# Patient Record
Sex: Female | Born: 1975 | Race: White | Hispanic: No | Marital: Married | Smoking: Current every day smoker
Health system: Southern US, Community
[De-identification: ages and names within clinical notes are randomized; demographics above are authoritative.]

## PROBLEM LIST (undated history)

## (undated) DIAGNOSIS — J449 Chronic obstructive pulmonary disease, unspecified: Secondary | ICD-10-CM

## (undated) DIAGNOSIS — M549 Dorsalgia, unspecified: Secondary | ICD-10-CM

## (undated) HISTORY — PX: TUBAL LIGATION: SHX77

---

## 2009-01-02 ENCOUNTER — Emergency Department (HOSPITAL_BASED_OUTPATIENT_CLINIC_OR_DEPARTMENT_OTHER): Admission: EM | Admit: 2009-01-02 | Discharge: 2009-01-02 | Payer: Self-pay | Admitting: Emergency Medicine

## 2009-02-08 ENCOUNTER — Emergency Department (HOSPITAL_COMMUNITY): Admission: EM | Admit: 2009-02-08 | Discharge: 2009-02-09 | Payer: Self-pay | Admitting: Emergency Medicine

## 2009-03-24 ENCOUNTER — Emergency Department (HOSPITAL_BASED_OUTPATIENT_CLINIC_OR_DEPARTMENT_OTHER): Admission: EM | Admit: 2009-03-24 | Discharge: 2009-03-24 | Payer: Self-pay | Admitting: Emergency Medicine

## 2009-09-19 ENCOUNTER — Emergency Department (HOSPITAL_COMMUNITY): Admission: EM | Admit: 2009-09-19 | Discharge: 2009-09-19 | Payer: Self-pay | Admitting: Emergency Medicine

## 2009-10-11 ENCOUNTER — Emergency Department (HOSPITAL_COMMUNITY): Admission: EM | Admit: 2009-10-11 | Discharge: 2009-10-11 | Payer: Self-pay | Admitting: Emergency Medicine

## 2009-10-25 ENCOUNTER — Ambulatory Visit: Payer: Self-pay | Admitting: Radiology

## 2009-10-25 ENCOUNTER — Emergency Department (HOSPITAL_BASED_OUTPATIENT_CLINIC_OR_DEPARTMENT_OTHER): Admission: EM | Admit: 2009-10-25 | Discharge: 2009-10-25 | Payer: Self-pay | Admitting: Emergency Medicine

## 2010-11-15 LAB — DIFFERENTIAL
Eosinophils Relative: 1 % (ref 0–5)
Lymphocytes Relative: 22 % (ref 12–46)
Lymphs Abs: 1 10*3/uL (ref 0.7–4.0)
Neutrophils Relative %: 69 % (ref 43–77)

## 2010-11-15 LAB — BASIC METABOLIC PANEL
CO2: 24 mEq/L (ref 19–32)
Calcium: 9.6 mg/dL (ref 8.4–10.5)
Chloride: 104 mEq/L (ref 96–112)
Creatinine, Ser: 0.76 mg/dL (ref 0.4–1.2)
GFR calc non Af Amer: >60 mL/min (ref >60)
Sodium: 138 mEq/L (ref 135–145)

## 2010-11-15 LAB — CBC
MCHC: 32.9 g/dL (ref 30.0–36.0)
MCV: 81.7 fL (ref 78.0–100.0)
Platelets: 285 10*3/uL (ref 150–400)

## 2010-12-06 LAB — URINALYSIS, ROUTINE W REFLEX MICROSCOPIC
Bilirubin Urine: NEGATIVE
Ketones, ur: NEGATIVE mg/dL
Nitrite: NEGATIVE

## 2010-12-06 LAB — URINE MICROSCOPIC-ADD ON

## 2010-12-06 LAB — URINE CULTURE: Colony Count: >=100000

## 2010-12-06 LAB — WET PREP, GENITAL: Yeast Wet Prep HPF POC: NONE SEEN

## 2010-12-07 LAB — COMPREHENSIVE METABOLIC PANEL
ALT: 15 U/L (ref 0–35)
Alkaline Phosphatase: 54 U/L (ref 39–117)
Creatinine, Ser: 0.63 mg/dL (ref 0.4–1.2)
GFR calc non Af Amer: >60 mL/min (ref >60)
Potassium: 3.3 mEq/L — ABNORMAL LOW (ref 3.5–5.1)
Sodium: 139 mEq/L (ref 135–145)
Total Bilirubin: 0.5 mg/dL (ref 0.3–1.2)

## 2010-12-07 LAB — URINALYSIS, ROUTINE W REFLEX MICROSCOPIC
Hgb urine dipstick: NEGATIVE
Nitrite: NEGATIVE
Specific Gravity, Urine: 1.01 (ref 1.005–1.030)

## 2010-12-07 LAB — GC/CHLAMYDIA PROBE AMP, GENITAL
Chlamydia, DNA Probe: NEGATIVE
GC Probe Amp, Genital: NEGATIVE

## 2010-12-07 LAB — DIFFERENTIAL
Basophils Relative: 0 % (ref 0–1)
Lymphocytes Relative: 18 % (ref 12–46)
Lymphs Abs: 1.2 10*3/uL (ref 0.7–4.0)
Monocytes Relative: 6 % (ref 3–12)

## 2010-12-07 LAB — WET PREP, GENITAL

## 2010-12-07 LAB — LIPASE, BLOOD: Lipase: 21 U/L (ref 11–59)

## 2010-12-07 LAB — RPR: RPR Ser Ql: NONREACTIVE

## 2010-12-07 LAB — CBC
Platelets: 222 10*3/uL (ref 150–400)
RDW: 15.1 % (ref 11.5–15.5)

## 2013-06-06 ENCOUNTER — Encounter (HOSPITAL_BASED_OUTPATIENT_CLINIC_OR_DEPARTMENT_OTHER): Payer: Self-pay | Admitting: Emergency Medicine

## 2013-06-06 ENCOUNTER — Emergency Department (HOSPITAL_BASED_OUTPATIENT_CLINIC_OR_DEPARTMENT_OTHER)
Admission: EM | Admit: 2013-06-06 | Discharge: 2013-06-06 | Disposition: A | Discharge status: Home / Self Care | Payer: No Typology Code available for payment source | Attending: Emergency Medicine | Admitting: Emergency Medicine

## 2013-06-06 DIAGNOSIS — Z79899 Other long term (current) drug therapy: Secondary | ICD-10-CM | POA: Insufficient documentation

## 2013-06-06 DIAGNOSIS — F17200 Nicotine dependence, unspecified, uncomplicated: Secondary | ICD-10-CM | POA: Insufficient documentation

## 2013-06-06 DIAGNOSIS — M545 Low back pain, unspecified: Secondary | ICD-10-CM | POA: Insufficient documentation

## 2013-06-06 DIAGNOSIS — M549 Dorsalgia, unspecified: Secondary | ICD-10-CM

## 2013-06-06 HISTORY — DX: Dorsalgia, unspecified: M54.9

## 2013-06-06 MED ORDER — HYDROCODONE-ACETAMINOPHEN 5-325 MG PO TABS: 2.0000 | ORAL_TABLET | ORAL | Status: DC | PRN

## 2013-06-06 MED ORDER — HYDROMORPHONE HCL PF 1 MG/ML IJ SOLN
1.0000 mg | Freq: Once | INTRAMUSCULAR | Status: AC
  Administered 2013-06-06: 1 mg via INTRAMUSCULAR
  Filled 2013-06-06: qty 1

## 2013-06-06 MED ORDER — ONDANSETRON HCL 4 MG/2ML IJ SOLN
4.0000 mg | Freq: Once | INTRAMUSCULAR | Status: AC
  Administered 2013-06-06: 4 mg via INTRAMUSCULAR
  Filled 2013-06-06: qty 2

## 2013-06-06 MED ORDER — METHYLPREDNISOLONE SODIUM SUCC 125 MG IJ SOLR
125.0000 mg | Freq: Once | INTRAMUSCULAR | Status: AC
  Administered 2013-06-06: 125 mg via INTRAMUSCULAR
  Filled 2013-06-06: qty 2

## 2013-06-06 MED ORDER — METHOCARBAMOL 500 MG PO TABS: 500.0000 mg | ORAL_TABLET | Freq: Two times a day (BID) | ORAL | Status: DC

## 2013-06-06 NOTE — ED Notes (Signed)
Pt c/o "pop" to lower back while picking up box of toilet paper at work-denies as WC injury

## 2013-06-06 NOTE — ED Provider Notes (Signed)
CSN: [id number]     Arrival date & time 06/06/13  1113 History   First MD Initiated Contact with Patient 06/06/13 1212     Chief Complaint  Patient presents with  . Back Pain   (Consider location/radiation/quality/duration/timing/severity/associated sxs/prior Treatment) Patient is a 37 y.o. female presenting with back pain. The history is provided by the patient. No language interpreter was used.  Back Pain Location:  Lumbar spine Quality:  Aching Radiates to:  Does not radiate Pain severity:  Moderate Pain is:  Worse during the day Onset quality:  Sudden Timing:  Constant Progression:  Worsening Chronicity:  New Context: lifting heavy objects   Relieved by:  Nothing Worsened by:  Movement Ineffective treatments:  None tried Pt reports severe pain with lifting a box  Past Medical History  Diagnosis Date  . Back pain    Past Surgical History  Procedure Laterality Date  . Cesarean section     No family history on file. History  Substance Use Topics  . Smoking status: Current Every Day Smoker  . Smokeless tobacco: Not on file  . Alcohol Use: No   OB History   Grav Para Term Preterm Abortions TAB SAB Ect Mult Living                 Review of Systems  Musculoskeletal: Positive for back pain.  All other systems reviewed and are negative.    Allergies  Sulfa antibiotics  Home Medications   Current Outpatient Rx  Name  Route  Sig  Dispense  Refill  . HYDROcodone-acetaminophen (NORCO/VICODIN) 5-325 MG per tablet   Oral   Take 2 tablets by mouth every 4 (four) hours as needed for pain.   20 tablet   0   . methocarbamol (ROBAXIN) 500 MG tablet   Oral   Take 1 tablet (500 mg total) by mouth 2 (two) times daily.   20 tablet   0    BP 110/66  Pulse 88  Temp(Src) 97.9 F (36.6 C) (Oral)  Resp 16  Ht 5\' 6"  (1.676 m)  Wt 150 lb (68.04 kg)  BMI 24.22 kg/m2  SpO2 100%  LMP 05/23/2013 Physical Exam  Nursing note and vitals reviewed. Constitutional: She  appears well-developed and well-nourished.  HENT:  Head: Normocephalic.  Right Ear: External ear normal.  Left Ear: External ear normal.  Nose: Nose normal.  Mouth/Throat: Oropharynx is clear and moist.  Eyes: Conjunctivae are normal. Pupils are equal, round, and reactive to light.  Neck: Normal range of motion. Neck supple.  Cardiovascular: Normal rate and normal heart sounds.   Pulmonary/Chest: Effort normal and breath sounds normal.  Abdominal: Soft. Bowel sounds are normal.  Musculoskeletal: Normal range of motion.  Neurological: She is alert.  Skin: Skin is warm.    ED Course  Procedures (including critical care time) Labs Review Labs Reviewed - No data to display Imaging Review No results found.  MDM   1. Back pain    Solumedrol and dilaudid  IM.  Rx for hydrocodone and robaxin   Elson Areas, PA-C 06/06/13 1357

## 2013-06-06 NOTE — ED Provider Notes (Signed)
Medical screening examination/treatment/procedure(s) were performed by non-physician practitioner and as supervising physician I was immediately available for consultation/collaboration.  Pariss Hommes, MD 06/06/13 1444 

## 2013-09-09 ENCOUNTER — Encounter (HOSPITAL_BASED_OUTPATIENT_CLINIC_OR_DEPARTMENT_OTHER): Payer: Self-pay | Admitting: Emergency Medicine

## 2013-09-09 ENCOUNTER — Emergency Department (HOSPITAL_BASED_OUTPATIENT_CLINIC_OR_DEPARTMENT_OTHER)
Admission: EM | Admit: 2013-09-09 | Discharge: 2013-09-09 | Disposition: A | Discharge status: Home / Self Care | Payer: No Typology Code available for payment source | Attending: Emergency Medicine | Admitting: Emergency Medicine

## 2013-09-09 DIAGNOSIS — X500XXA Overexertion from strenuous movement or load, initial encounter: Secondary | ICD-10-CM | POA: Insufficient documentation

## 2013-09-09 DIAGNOSIS — G8929 Other chronic pain: Secondary | ICD-10-CM | POA: Insufficient documentation

## 2013-09-09 DIAGNOSIS — F17200 Nicotine dependence, unspecified, uncomplicated: Secondary | ICD-10-CM | POA: Insufficient documentation

## 2013-09-09 DIAGNOSIS — Y9289 Other specified places as the place of occurrence of the external cause: Secondary | ICD-10-CM | POA: Insufficient documentation

## 2013-09-09 DIAGNOSIS — S39012A Strain of muscle, fascia and tendon of lower back, initial encounter: Secondary | ICD-10-CM

## 2013-09-09 DIAGNOSIS — R110 Nausea: Secondary | ICD-10-CM | POA: Insufficient documentation

## 2013-09-09 DIAGNOSIS — S335XXA Sprain of ligaments of lumbar spine, initial encounter: Secondary | ICD-10-CM | POA: Insufficient documentation

## 2013-09-09 DIAGNOSIS — Y9389 Activity, other specified: Secondary | ICD-10-CM | POA: Insufficient documentation

## 2013-09-09 MED ORDER — CYCLOBENZAPRINE HCL 10 MG PO TABS: 10.0000 mg | ORAL_TABLET | Freq: Two times a day (BID) | ORAL | Status: DC | PRN

## 2013-09-09 MED ORDER — IBUPROFEN 600 MG PO TABS: 600.0000 mg | ORAL_TABLET | Freq: Four times a day (QID) | ORAL | Status: DC | PRN

## 2013-09-09 MED ORDER — HYDROCODONE-ACETAMINOPHEN 5-325 MG PO TABS: 2.0000 | ORAL_TABLET | ORAL | Status: DC | PRN

## 2013-09-09 NOTE — ED Notes (Signed)
Pt reports she lifted her foot in the shower last night and heard a pop. C/o back pain going down both legs

## 2013-09-09 NOTE — ED Provider Notes (Signed)
CSN: [id number]     Arrival date & time 09/09/13  1825 History  This chart was scribed for non-physician practitioner Kerrie BuffaloHope Tadeusz Stahl, NP working with Hurman HornJohn M Bednar, MD by Dorothey Basemania Sutton, ED Scribe. This patient was seen in room MH06/MH06 and the patient's care was started at 8:14 PM.    Chief Complaint  Patient presents with  . Back Pain   The history is provided by the patient. No language interpreter was used.   HPI Comments: Monica Arnold is a 38 y.o. 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 She states that the pain radiates down the bilateral legs. She reports some associated intermittent nausea secondary to pain. Patient reports that her current symptoms feel similar to her past chronic pain, but was exacerbated after the incident. She reports taking ibuprofen and Robaxin at home with mild, temporary relief. She denies bowel or bladder incontinence, fever, chills, emesis, abdominal pain. Patient has no other pertinent medical history.   Past Medical History  Diagnosis Date  . Back pain    Past Surgical History  Procedure Laterality Date  . Cesarean section     No family history on file. History  Substance Use Topics  . Smoking status: Current Every Day Smoker  . Smokeless tobacco: Not on file  . Alcohol Use: No   OB History   Grav Para Term Preterm Abortions TAB SAB Ect Mult Living                 Review of Systems  Constitutional: Negative for fever and chills.  Gastrointestinal: Positive for nausea. Negative for vomiting and abdominal pain.  Musculoskeletal: Positive for back pain.    Allergies  Sulfa antibiotics  Home Medications   Current Outpatient Rx  Name  Route  Sig  Dispense  Refill  . HYDROcodone-acetaminophen (NORCO/VICODIN) 5-325 MG per tablet   Oral    Take 2 tablets by mouth every 4 (four) hours as needed for pain.   20 tablet   0   . methocarbamol (ROBAXIN) 500 MG tablet   Oral   Take 1 tablet (500 mg total) by mouth 2 (two) times daily.   20 tablet   0    Triage Vitals: BP 115/57  Pulse 91  Temp(Src) 98.2 F (36.8 C) (Oral)  Resp 18  Ht 5\' 6"  (1.676 m)  Wt 150 lb (68.04 kg)  BMI 24.22 kg/m2  SpO2 100%  LMP 08/29/2013  Physical Exam  Nursing note and vitals reviewed. Constitutional: She is oriented to person, place, and time. She appears well-developed and well-nourished. No distress.  HENT:  Head: Normocephalic and atraumatic.  Eyes: Conjunctivae are normal.  Neck: Normal range of motion. Neck supple.  Cardiovascular: Normal rate, regular rhythm, normal heart sounds and intact distal pulses.   Pulses:      Radial pulses are 2+ on the right side, and 2+ on the left side.       Dorsalis pedis pulses are 2+ on the right side, and 2+ on the left side.  Pulmonary/Chest: Effort normal and breath sounds normal. No respiratory distress.  Abdominal: Soft. Bowel sounds are normal. She exhibits no distension. There is no tenderness.  Musculoskeletal: Normal range of motion.  Bilateral straight-leg raise without difficulty. Tenderness to palpation to the bilateral lumbar  paraspinal muscles that radiates into the bilateral buttocks.   Neurological: She is alert and oriented to person, place, and time. She has normal reflexes. She displays normal reflexes.  Reflex Scores:      Brachioradialis reflexes are 2+ on the right side and 2+ on the left side.      Patellar reflexes are 2+ on the right side and 2+ on the left side. Distal sensation intact and equal bilaterally. Good strength of bilateral upper and lower extremities. Normal, steady gait with no foot-drag.  Skin: Skin is warm and dry.  Psychiatric: She has a normal mood and affect. Her behavior is normal.    ED Course  Procedures (including critical care time)  DIAGNOSTIC  STUDIES: Oxygen Saturation is 100% on room air, normal by my interpretation.    COORDINATION OF CARE: 8:21 PM- Discussed that imaging will not be necessary today in the ED. Will discharge patient with anti-inflammatory pain medication, and muscle relaxants. Discussed treatment plan with patient at bedside and patient verbalized agreement.    MDM  38 y.o. female with low back pain. Normal neuro exam. Patient with history of chronic pain. Discussed with the patient and all questioned fully answered. She will return if any problems arise.    Medication List    TAKE these medications       cyclobenzaprine 10 MG tablet  Commonly known as:  FLEXERIL  Take 1 tablet (10 mg total) by mouth 2 (two) times daily as needed for muscle spasms.     ibuprofen 600 MG tablet  Commonly known as:  ADVIL,MOTRIN  Take 1 tablet (600 mg total) by mouth every 6 (six) hours as needed.      ASK your doctor about these medications       HYDROcodone-acetaminophen 5-325 MG per tablet  Commonly known as:  NORCO/VICODIN  Take 2 tablets by mouth every 4 (four) hours as needed for pain.  Ask about: Which instructions should I use?     HYDROcodone-acetaminophen 5-325 MG per tablet  Commonly known as:  NORCO/VICODIN  Take 2 tablets by mouth every 4 (four) hours as needed.  Ask about: Which instructions should I use?     methocarbamol 500 MG tablet  Commonly known as:  ROBAXIN  Take 1 tablet (500 mg total) by mouth 2 (two) times daily.           Janne Napoleon, [place] 09/10/13 [phone]

## 2013-09-09 NOTE — ED Notes (Signed)
Denies difficulty with bowel/bladder.  Pain radiates to left hip and down anterior right thigh.  Denies numbness.

## 2013-09-10 NOTE — ED Provider Notes (Signed)
Medical screening examination/treatment/procedure(s) were performed by non-physician practitioner and as supervising physician I was immediately available for consultation/collaboration.  EKG Interpretation   None        Hurman HornJohn M Arlyce Circle, MD 09/10/13 2119

## 2013-10-23 ENCOUNTER — Encounter: Payer: Self-pay | Admitting: Adult Health

## 2013-11-20 ENCOUNTER — Encounter: Payer: Self-pay | Admitting: Obstetrics & Gynecology

## 2013-11-27 ENCOUNTER — Encounter: Payer: Self-pay | Admitting: Obstetrics & Gynecology

## 2013-11-27 ENCOUNTER — Other Ambulatory Visit (HOSPITAL_COMMUNITY)
Admission: RE | Admit: 2013-11-27 | Discharge: 2013-11-27 | Disposition: A | Discharge status: Home / Self Care | Payer: No Typology Code available for payment source | Source: Ambulatory Visit | Attending: Obstetrics & Gynecology | Admitting: Obstetrics & Gynecology

## 2013-11-27 ENCOUNTER — Ambulatory Visit (INDEPENDENT_AMBULATORY_CARE_PROVIDER_SITE_OTHER): Payer: Self-pay | Admitting: Obstetrics & Gynecology

## 2013-11-27 VITALS — BP 110/80 | Ht 66.0 in | Wt 166.0 lb

## 2013-11-27 DIAGNOSIS — N920 Excessive and frequent menstruation with regular cycle: Secondary | ICD-10-CM

## 2013-11-27 DIAGNOSIS — N946 Dysmenorrhea, unspecified: Secondary | ICD-10-CM

## 2013-11-27 DIAGNOSIS — N898 Other specified noninflammatory disorders of vagina: Secondary | ICD-10-CM

## 2013-11-27 DIAGNOSIS — Z1151 Encounter for screening for human papillomavirus (HPV): Secondary | ICD-10-CM | POA: Insufficient documentation

## 2013-11-27 DIAGNOSIS — Z01419 Encounter for gynecological examination (general) (routine) without abnormal findings: Secondary | ICD-10-CM | POA: Insufficient documentation

## 2013-11-27 DIAGNOSIS — N939 Abnormal uterine and vaginal bleeding, unspecified: Secondary | ICD-10-CM

## 2013-11-27 LAB — POCT HEMOGLOBIN: Hemoglobin: 11 g/dL — AB (ref 12.2–16.2)

## 2013-11-27 MED ORDER — MEGESTROL ACETATE 40 MG PO TABS: ORAL_TABLET | ORAL | Status: DC

## 2013-11-27 NOTE — Addendum Note (Signed)
Addended by: Richardson ChiquitoRAVIS, Aibhlinn Kalmar M on: 11/27/2013 03:11 PM   Modules accepted: Orders

## 2013-11-27 NOTE — Progress Notes (Signed)
Patient ID: Monica Arnold, female   DOB: Dec 18, 1975, 38 y.o.   MRN: [id number] Pt has been having increasingly worsening heavier painful periods for the past 6 -8 months, always a problem though S/p btl No other chronic medical problems Bleeds q 2 weeks for 7-10 days Not sexually active, no history of painful intercourse  Exam Vulva is normal without lesions Vagina is pink moist without discharge Cervix normal in appearance and pap is done Uterus is retroverted NSSC Adnexa is negative with normal sized ovaries   Menometrorrhagia Dysmenorrhea  Megestrol 40 mg daily Follow up in 6 weeks  Past Medical History  Diagnosis Date  . Back pain     Past Surgical History  Procedure Laterality Date  . Cesarean section      OB History   Grav Para Term Preterm Abortions TAB SAB Ect Mult Living                  Allergies  Allergen Reactions  . Sulfa Antibiotics Other (See Comments)    blisters    History   Social History  . Marital Status: Single    Spouse Name: N/A    Number of Children: N/A  . Years of Education: N/A   Social History Main Topics  . Smoking status: Current Every Day Smoker  . Smokeless tobacco: None  . Alcohol Use: No  . Drug Use: No  . Sexual Activity: Not Currently    Birth Control/ Protection: None   Other Topics Concern  . None   Social History Narrative  . None    Family History  Problem Relation Age of Onset  . Heart disease Mother   . Heart disease Father

## 2013-12-25 ENCOUNTER — Encounter: Payer: Self-pay | Admitting: *Deleted

## 2014-01-08 ENCOUNTER — Encounter: Payer: Self-pay | Admitting: Obstetrics & Gynecology

## 2014-01-08 ENCOUNTER — Ambulatory Visit (INDEPENDENT_AMBULATORY_CARE_PROVIDER_SITE_OTHER): Payer: Self-pay | Admitting: Obstetrics & Gynecology

## 2014-01-08 ENCOUNTER — Telehealth: Payer: Self-pay | Admitting: Obstetrics & Gynecology

## 2014-01-08 VITALS — BP 120/80 | Wt 166.0 lb

## 2014-01-08 DIAGNOSIS — N920 Excessive and frequent menstruation with regular cycle: Secondary | ICD-10-CM

## 2014-01-08 LAB — POCT HEMOGLOBIN: HEMOGLOBIN: 12.6 g/dL (ref 12.2–16.2)

## 2014-01-08 MED ORDER — MEGESTROL ACETATE 40 MG PO TABS: ORAL_TABLET | ORAL | Status: DC

## 2014-01-08 NOTE — Addendum Note (Signed)
Addended by: Criss AlvinePULLIAM, Avanish Cerullo G on: 01/08/2014 12:26 PM   Modules accepted: Orders

## 2014-01-08 NOTE — Progress Notes (Signed)
Patient ID: Monica FantasiaDana Arnold, female   DOB: 09/17/1975, 38 y.o.   MRN: [id number] Still bleeding on megestrol Will make plans to proceed with endoablation scheduled

## 2014-01-09 ENCOUNTER — Telehealth: Payer: Self-pay | Admitting: *Deleted

## 2014-01-09 ENCOUNTER — Encounter (HOSPITAL_COMMUNITY): Payer: Self-pay | Admitting: Pharmacy Technician

## 2014-01-09 NOTE — Telephone Encounter (Signed)
Pt had several questions about her surgery date and recovery time. Pt informed usually a weeks recovery time for endometrial ablation. Pt does have a post op appt on 01/23/2014.

## 2014-01-10 NOTE — Patient Instructions (Signed)
Your procedure is scheduled on: 01/16/2014  [facility] at  7:30   AM.  Call this number if you have problems the morning of surgery: [phone]   Remember:   Do not drink or eat food:After Midnight.  :  Take these medicines the morning of surgery with A SIP OF WATER: none   Do not wear jewelry, make-up or nail polish.  Do not wear lotions, powders, or perfumes. You may wear deodorant.  Do not shave 48 hours prior to surgery. Men may shave face and neck.  Do not bring valuables to the hospital.  Contacts, dentures or bridgework may not be worn into surgery.  Leave suitcase in the car. After surgery it may be brought to your room.  For patients admitted to the hospital, checkout time is 11:00 AM the day of discharge.   Patients discharged the day of surgery will not be allowed to drive home.    Special Instructions: Shower using CHG night before surgery and shower the day of surgery use CHG.  Use special wash - you have one bottle of CHG for all showers.  You should use approximately 1/2 of the bottle for each shower.   Please read over the following fact sheets that you were given: Pain Booklet, MRSA Information, Surgical Site Infection Prevention and Care and Recovery After Surgery  Laparoscopic Cholecystectomy, Care After Refer to this sheet in the next few weeks. These instructions provide you with information on caring for yourself after your procedure. Your health care provider may also give you more specific instructions. Your treatment has been planned according to current medical practices, but problems sometimes occur. Call your health care provider if you have any problems or questions after your procedure. WHAT TO EXPECT AFTER THE PROCEDURE After your procedure, it is typical to have the following:  Pain at your incision sites. You will be given pain medicines to control the pain.  Mild nausea or vomiting. This should improve after the first 24 hours.  Bloating and  possibly shoulder pain from the gas used during the procedure. This will improve after the first 24 hours. HOME CARE INSTRUCTIONS   Change bandages (dressings) as directed by your health care provider.  Keep the wound dry and clean. You may wash the wound gently with soap and water. Gently blot or dab the area dry.  Do not take baths or use swimming pools or hot tubs for 2 weeks or until your health care provider approves.  Only take over-the-counter or prescription medicines as directed by your health care provider.  Continue your normal diet as directed by your health care provider.  Do not lift anything heavier than 10 pounds (4.5 kg) until your health care provider approves.  Do not play contact sports for 1 week or until your health care provider approves. SEEK MEDICAL CARE IF:   You have redness, swelling, or increasing pain in the wound.  You notice yellowish-white fluid (pus) coming from the wound.  You have drainage from the wound that lasts longer than 1 day.  You notice a bad smell coming from the wound or dressing.  Your surgical cuts (incisions) break open. SEEK IMMEDIATE MEDICAL CARE IF:   You develop a rash.  You have difficulty breathing.  You have chest pain.  You have a fever.  You have increasing pain in the shoulders (shoulder strap areas).  You have dizzy episodes or faint while standing.  You have severe abdominal pain.  You feel  sick to your stomach (nauseous) or throw up (vomit) and this lasts for more than 1 day. Document Released: 08/16/2005 Document Revised: 06/06/2013 Document Reviewed: 03/28/2013 [facility] Patient Information 2014 [place]. General Anesthesia, Adult, Care After Refer to this sheet in the next few weeks. These instructions provide you with information on caring for yourself after your procedure. Your health care provider may also give you more specific instructions. Your treatment has been planned according to current  medical practices, but problems sometimes occur. Call your health care provider if you have any problems or questions after your procedure. WHAT TO EXPECT AFTER THE PROCEDURE After the procedure, it is typical to experience:  Sleepiness.  Nausea and vomiting. HOME CARE INSTRUCTIONS  For the first 24 hours after general anesthesia:  Have a responsible person with you.  Do not drive a car. If you are alone, do not take public transportation.  Do not drink alcohol.  Do not take medicine that has not been prescribed by your health care provider.  Do not sign important papers or make important decisions.  You may resume a normal diet and activities as directed by your health care provider.  Change bandages (dressings) as directed.  If you have questions or problems that seem related to general anesthesia, call the hospital and ask for the anesthetist or anesthesiologist on call. SEEK MEDICAL CARE IF:  You have nausea and vomiting that continue the day after anesthesia.  You develop a rash. SEEK IMMEDIATE MEDICAL CARE IF:   You have difficulty breathing.  You have chest pain.  You have any allergic problems. Document Released: 11/22/2000 Document Revised: 04/18/2013 Document Reviewed: 03/01/2013 [facility] Patient Information 2014 [place], [place].

## 2014-01-11 ENCOUNTER — Encounter (HOSPITAL_COMMUNITY)
Admission: RE | Admit: 2014-01-11 | Discharge: 2014-01-11 | Disposition: A | Discharge status: Home / Self Care | Payer: No Typology Code available for payment source | Source: Ambulatory Visit | Attending: Obstetrics & Gynecology | Admitting: Obstetrics & Gynecology

## 2014-01-11 ENCOUNTER — Encounter (HOSPITAL_COMMUNITY): Payer: Self-pay

## 2014-01-11 DIAGNOSIS — Z01812 Encounter for preprocedural laboratory examination: Secondary | ICD-10-CM | POA: Insufficient documentation

## 2014-01-11 LAB — COMPREHENSIVE METABOLIC PANEL
ALBUMIN: 3.8 g/dL (ref 3.5–5.2)
ALT: 12 U/L (ref 0–35)
AST: 14 U/L (ref 0–37)
Alkaline Phosphatase: 61 U/L (ref 39–117)
BILIRUBIN TOTAL: 0.5 mg/dL (ref 0.3–1.2)
BUN: 13 mg/dL (ref 6–23)
CHLORIDE: 103 meq/L (ref 96–112)
CO2: 23 mEq/L (ref 19–32)
CREATININE: 0.66 mg/dL (ref 0.50–1.10)
Calcium: 9.4 mg/dL (ref 8.4–10.5)
GFR calc Af Amer: >90 mL/min (ref >90)
GFR calc non Af Amer: >90 mL/min (ref >90)
Glucose, Bld: 108 mg/dL — ABNORMAL HIGH (ref 70–99)
POTASSIUM: 4.1 meq/L (ref 3.7–5.3)
Sodium: 140 mEq/L (ref 137–147)
TOTAL PROTEIN: 7.3 g/dL (ref 6.0–8.3)

## 2014-01-11 LAB — URINALYSIS, ROUTINE W REFLEX MICROSCOPIC
Bilirubin Urine: NEGATIVE
GLUCOSE, UA: NEGATIVE mg/dL
Ketones, ur: NEGATIVE mg/dL
Leukocytes, UA: NEGATIVE
NITRITE: NEGATIVE
PH: 6 (ref 5.0–8.0)
Protein, ur: NEGATIVE mg/dL
Specific Gravity, Urine: 1.01 (ref 1.005–1.030)
Urobilinogen, UA: 0.2 mg/dL (ref 0.0–1.0)

## 2014-01-11 LAB — CBC
HEMATOCRIT: 36.5 % (ref 36.0–46.0)
Hemoglobin: 12.1 g/dL (ref 12.0–15.0)
MCH: 24.7 pg — ABNORMAL LOW (ref 26.0–34.0)
MCHC: 33.2 g/dL (ref 30.0–36.0)
MCV: 74.5 fL — AB (ref 78.0–100.0)
PLATELETS: 263 10*3/uL (ref 150–400)
RBC: 4.9 MIL/uL (ref 3.87–5.11)
RDW: 15.3 % (ref 11.5–15.5)
WBC: 5.5 10*3/uL (ref 4.0–10.5)

## 2014-01-11 LAB — HCG, QUANTITATIVE, PREGNANCY: hCG, Beta Chain, Quant, S: <1 m[IU]/mL (ref <5)

## 2014-01-11 LAB — URINE MICROSCOPIC-ADD ON

## 2014-01-16 ENCOUNTER — Encounter (HOSPITAL_COMMUNITY): Payer: Self-pay

## 2014-01-16 ENCOUNTER — Encounter (HOSPITAL_COMMUNITY)
Admission: RE | Disposition: A | Discharge status: Home / Self Care | Payer: Self-pay | Source: Ambulatory Visit | Attending: Obstetrics & Gynecology

## 2014-01-16 ENCOUNTER — Encounter (HOSPITAL_COMMUNITY): Payer: No Typology Code available for payment source | Admitting: Anesthesiology

## 2014-01-16 ENCOUNTER — Ambulatory Visit (HOSPITAL_COMMUNITY)
Admission: RE | Admit: 2014-01-16 | Discharge: 2014-01-16 | Disposition: A | Discharge status: Home / Self Care | Payer: No Typology Code available for payment source | Source: Ambulatory Visit | Attending: Obstetrics & Gynecology | Admitting: Obstetrics & Gynecology

## 2014-01-16 ENCOUNTER — Ambulatory Visit (HOSPITAL_COMMUNITY): Payer: No Typology Code available for payment source | Admitting: Anesthesiology

## 2014-01-16 DIAGNOSIS — F17200 Nicotine dependence, unspecified, uncomplicated: Secondary | ICD-10-CM | POA: Insufficient documentation

## 2014-01-16 DIAGNOSIS — N920 Excessive and frequent menstruation with regular cycle: Secondary | ICD-10-CM | POA: Insufficient documentation

## 2014-01-16 DIAGNOSIS — N840 Polyp of corpus uteri: Secondary | ICD-10-CM

## 2014-01-16 DIAGNOSIS — Z9889 Other specified postprocedural states: Secondary | ICD-10-CM

## 2014-01-16 DIAGNOSIS — N946 Dysmenorrhea, unspecified: Secondary | ICD-10-CM | POA: Insufficient documentation

## 2014-01-16 HISTORY — PX: DILITATION & CURRETTAGE/HYSTROSCOPY WITH THERMACHOICE ABLATION: SHX5569

## 2014-01-16 SURGERY — DILATATION & CURETTAGE/HYSTEROSCOPY WITH THERMACHOICE ABLATION
Anesthesia: General | Site: Vagina

## 2014-01-16 MED ORDER — KETOROLAC TROMETHAMINE 30 MG/ML IJ SOLN
INTRAMUSCULAR | Status: AC
  Filled 2014-01-16: qty 1

## 2014-01-16 MED ORDER — MIDAZOLAM HCL 2 MG/2ML IJ SOLN
INTRAMUSCULAR | Status: AC
  Filled 2014-01-16: qty 2

## 2014-01-16 MED ORDER — KETOROLAC TROMETHAMINE 10 MG PO TABS: 10.0000 mg | ORAL_TABLET | Freq: Three times a day (TID) | ORAL | Status: DC | PRN

## 2014-01-16 MED ORDER — LIDOCAINE HCL (PF) 1 % IJ SOLN
INTRAMUSCULAR | Status: AC
  Filled 2014-01-16: qty 5

## 2014-01-16 MED ORDER — LACTATED RINGERS IV SOLN
INTRAVENOUS | Status: DC
  Administered 2014-01-16: 08:00:00 via INTRAVENOUS

## 2014-01-16 MED ORDER — FENTANYL CITRATE 0.05 MG/ML IJ SOLN
25.0000 ug | INTRAMUSCULAR | Status: AC
  Administered 2014-01-16 (×2): 25 ug via INTRAVENOUS

## 2014-01-16 MED ORDER — ONDANSETRON HCL 4 MG/2ML IJ SOLN
INTRAMUSCULAR | Status: AC
  Filled 2014-01-16: qty 2

## 2014-01-16 MED ORDER — FENTANYL CITRATE 0.05 MG/ML IJ SOLN
INTRAMUSCULAR | Status: AC
  Filled 2014-01-16: qty 2

## 2014-01-16 MED ORDER — KETOROLAC TROMETHAMINE 30 MG/ML IJ SOLN
30.0000 mg | Freq: Once | INTRAMUSCULAR | Status: AC
  Administered 2014-01-16: 30 mg via INTRAVENOUS

## 2014-01-16 MED ORDER — GLYCOPYRROLATE 0.2 MG/ML IJ SOLN
0.2000 mg | Freq: Once | INTRAMUSCULAR | Status: AC
  Administered 2014-01-16: 0.2 mg via INTRAVENOUS

## 2014-01-16 MED ORDER — LIDOCAINE HCL (CARDIAC) 20 MG/ML IV SOLN
INTRAVENOUS | Status: DC | PRN
  Administered 2014-01-16: 50 mg via INTRAVENOUS

## 2014-01-16 MED ORDER — CEFAZOLIN SODIUM-DEXTROSE 2-3 GM-% IV SOLR
INTRAVENOUS | Status: AC
  Filled 2014-01-16: qty 50

## 2014-01-16 MED ORDER — CEFAZOLIN SODIUM-DEXTROSE 2-3 GM-% IV SOLR
2.0000 g | INTRAVENOUS | Status: AC
  Administered 2014-01-16: 2 g via INTRAVENOUS

## 2014-01-16 MED ORDER — SODIUM CHLORIDE 0.9 % IR SOLN
Status: DC | PRN
  Administered 2014-01-16: 1000 mL

## 2014-01-16 MED ORDER — ARTIFICIAL TEARS OP OINT
TOPICAL_OINTMENT | OPHTHALMIC | Status: AC
  Filled 2014-01-16: qty 3.5

## 2014-01-16 MED ORDER — PROPOFOL 10 MG/ML IV BOLUS
INTRAVENOUS | Status: DC | PRN
  Administered 2014-01-16: 150 mg via INTRAVENOUS

## 2014-01-16 MED ORDER — ONDANSETRON HCL 8 MG PO TABS: 8.0000 mg | ORAL_TABLET | Freq: Three times a day (TID) | ORAL | Status: DC | PRN

## 2014-01-16 MED ORDER — PROPOFOL 10 MG/ML IV EMUL
INTRAVENOUS | Status: AC
  Filled 2014-01-16: qty 20

## 2014-01-16 MED ORDER — FENTANYL CITRATE 0.05 MG/ML IJ SOLN: 25.0000 ug | INTRAMUSCULAR | Status: DC | PRN

## 2014-01-16 MED ORDER — LACTATED RINGERS IV SOLN
INTRAVENOUS | Status: DC | PRN
  Administered 2014-01-16: 08:00:00 via INTRAVENOUS

## 2014-01-16 MED ORDER — SUCCINYLCHOLINE CHLORIDE 20 MG/ML IJ SOLN
INTRAMUSCULAR | Status: AC
  Filled 2014-01-16: qty 1

## 2014-01-16 MED ORDER — FENTANYL CITRATE 0.05 MG/ML IJ SOLN
INTRAMUSCULAR | Status: DC | PRN
  Administered 2014-01-16: 100 ug via INTRAVENOUS
  Administered 2014-01-16 (×3): 50 ug via INTRAVENOUS

## 2014-01-16 MED ORDER — MIDAZOLAM HCL 2 MG/2ML IJ SOLN
1.0000 mg | INTRAMUSCULAR | Status: DC | PRN
  Administered 2014-01-16 (×2): 2 mg via INTRAVENOUS

## 2014-01-16 MED ORDER — ONDANSETRON HCL 4 MG/2ML IJ SOLN
4.0000 mg | Freq: Once | INTRAMUSCULAR | Status: AC | PRN
  Administered 2014-01-16: 4 mg via INTRAVENOUS

## 2014-01-16 MED ORDER — FENTANYL CITRATE 0.05 MG/ML IJ SOLN
INTRAMUSCULAR | Status: AC
  Filled 2014-01-16: qty 5

## 2014-01-16 MED ORDER — DEXTROSE 5 % IV SOLN
INTRAVENOUS | Status: DC | PRN
  Administered 2014-01-16: 57 mL via INTRAVENOUS

## 2014-01-16 MED ORDER — ONDANSETRON HCL 4 MG/2ML IJ SOLN
4.0000 mg | Freq: Once | INTRAMUSCULAR | Status: AC
  Administered 2014-01-16: 4 mg via INTRAVENOUS

## 2014-01-16 MED ORDER — GLYCOPYRROLATE 0.2 MG/ML IJ SOLN
INTRAMUSCULAR | Status: AC
  Filled 2014-01-16: qty 1

## 2014-01-16 MED ORDER — HYDROCODONE-ACETAMINOPHEN 5-325 MG PO TABS: 1.0000 | ORAL_TABLET | Freq: Four times a day (QID) | ORAL | Status: DC | PRN

## 2014-01-16 SURGICAL SUPPLY — 33 items
BAG DECANTER FOR FLEXI CONT (MISCELLANEOUS) ×3 IMPLANT
BAG HAMPER (MISCELLANEOUS) ×3 IMPLANT
CATH THERMACHOICE III (CATHETERS) ×3 IMPLANT
CLOTH BEACON ORANGE TIMEOUT ST (SAFETY) ×3 IMPLANT
COVER LIGHT HANDLE STERIS (MISCELLANEOUS) ×6 IMPLANT
COVER MAYO STAND XLG (DRAPE) ×3 IMPLANT
FORMALIN 10 PREFIL 120ML (MISCELLANEOUS) ×3 IMPLANT
GAUZE SPONGE 4X4 16PLY XRAY LF (GAUZE/BANDAGES/DRESSINGS) ×3 IMPLANT
GLOVE BIOGEL PI IND STRL 6.5 (GLOVE) ×1 IMPLANT
GLOVE BIOGEL PI IND STRL 8 (GLOVE) ×1 IMPLANT
GLOVE BIOGEL PI INDICATOR 6.5 (GLOVE) ×2
GLOVE BIOGEL PI INDICATOR 8 (GLOVE) ×2
GLOVE ECLIPSE 8.0 STRL XLNG CF (GLOVE) ×3 IMPLANT
GLOVE EXAM NITRILE PF LG BLUE (GLOVE) ×3 IMPLANT
GOWN STRL REUS W/TWL LRG LVL3 (GOWN DISPOSABLE) ×3 IMPLANT
GOWN STRL REUS W/TWL XL LVL3 (GOWN DISPOSABLE) ×3 IMPLANT
INST SET HYSTEROSCOPY (KITS) ×3 IMPLANT
IV D5W 500ML (IV SOLUTION) ×3 IMPLANT
IV NS 1000ML (IV SOLUTION) ×2
IV NS 1000ML BAXH (IV SOLUTION) ×1 IMPLANT
KIT ROOM TURNOVER AP CYSTO (KITS) ×3 IMPLANT
MANIFOLD NEPTUNE II (INSTRUMENTS) ×3 IMPLANT
MARKER SKIN DUAL TIP RULER LAB (MISCELLANEOUS) ×3 IMPLANT
NS IRRIG 1000ML POUR BTL (IV SOLUTION) IMPLANT
PACK BASIC III (CUSTOM PROCEDURE TRAY) ×2
PACK SRG BSC III STRL LF ECLPS (CUSTOM PROCEDURE TRAY) ×1 IMPLANT
PAD ARMBOARD 7.5X6 YLW CONV (MISCELLANEOUS) ×3 IMPLANT
PAD TELFA 3X4 1S STER (GAUZE/BANDAGES/DRESSINGS) ×6 IMPLANT
PENCIL HANDSWITCHING (ELECTRODE) ×3 IMPLANT
SET BASIN LINEN APH (SET/KITS/TRAYS/PACK) ×3 IMPLANT
SET IRRIG Y TYPE TUR BLADDER L (SET/KITS/TRAYS/PACK) ×3 IMPLANT
SHEET LAVH (DRAPES) ×3 IMPLANT
YANKAUER SUCT BULB TIP 10FT TU (MISCELLANEOUS) ×3 IMPLANT

## 2014-01-16 NOTE — H&P (Signed)
Patient ID: Monica Arnold, female DOB: 09/02/1975, 38 y.o. MRN: [id number]  Pt has been having increasingly worsening heavier painful periods for the past 6 -8 months, always a problem though  S/p btl  No other chronic medical problems  Bleeds q 2 weeks for 7-10 days  Not sexually active, no history of painful intercourse    Still bleeding on megestrol  Will make plans to proceed with endoablation  scheduled   Preoperative History and Physical  Monica Arnold is a 38 y.o. No obstetric history on file. with Patient's last menstrual period was 12/11/2013. admitted for a hysteroscopy uterine curettage and endometrial ablation.  See the attached notes above, history of a normal sonogram, non response to megestrol, completed child bearing and no chronic pelvic pain  PMH:    Past Medical History  Diagnosis Date  . Back pain     PSH:     Past Surgical History  Procedure Laterality Date  . Cesarean section    . Tubal ligation      POb/GynH:      OB History   Grav Para Term Preterm Abortions TAB SAB Ect Mult Living                  SH:   History  Substance Use Topics  . Smoking status: Current Every Day Smoker -- 0.75 packs/day for 20 years    Types: Cigarettes  . Smokeless tobacco: Not on file  . Alcohol Use: No    FH:    Family History  Problem Relation Age of Onset  . Heart disease Mother   . Heart disease Father      Allergies:  Allergies  Allergen Reactions  . Sulfa Antibiotics Other (See Comments)    blisters    Medications:      Current facility-administered medications:ceFAZolin (ANCEF) IVPB 2 g/50 mL premix, 2 g, Intravenous, On Call to OR, Lazaro ArmsLuther H Jannice Beitzel, MD;  ketorolac (TORADOL) 30 MG/ML injection 30 mg, 30 mg, Intravenous, Once, Lazaro ArmsLuther H Anuel Sitter, MD  Review of Systems:   Review of Systems  Constitutional: Negative for fever, chills, weight loss, malaise/fatigue and diaphoresis.  HENT: Negative for hearing loss, ear pain, nosebleeds, congestion, sore  throat, neck pain, tinnitus and ear discharge.   Eyes: Negative for blurred vision, double vision, photophobia, pain, discharge and redness.  Respiratory: Negative for cough, hemoptysis, sputum production, shortness of breath, wheezing and stridor.   Cardiovascular: Negative for chest pain, palpitations, orthopnea, claudication, leg swelling and PND.  Gastrointestinal: negativefor abdominal pain. Negative for heartburn, nausea, vomiting, diarrhea, constipation, blood in stool and melena.  Genitourinary: Negative for dysuria, urgency, frequency, hematuria and flank pain.  Musculoskeletal: Negative for myalgias, back pain, joint pain and falls.  Skin: Negative for itching and rash.  Neurological: Negative for dizziness, tingling, tremors, sensory change, speech change, focal weakness, seizures, loss of consciousness, weakness and headaches.  Endo/Heme/Allergies: Negative for environmental allergies and polydipsia. Does not bruise/bleed easily.  Psychiatric/Behavioral: Negative for depression, suicidal ideas, hallucinations, memory loss and substance abuse. The patient is not nervous/anxious and does not have insomnia.      PHYSICAL EXAM:  Last menstrual period 12/11/2013.    Vitals reviewed. Constitutional: She is oriented to person, place, and time. She appears well-developed and well-nourished.  HENT:  Head: Normocephalic and atraumatic.  Right Ear: External ear normal.  Left Ear: External ear normal.  Nose: Nose normal.  Mouth/Throat: Oropharynx is clear and moist.  Eyes: Conjunctivae and EOM are normal. Pupils are equal,  round, and reactive to light. Right eye exhibits no discharge. Left eye exhibits no discharge. No scleral icterus.  Neck: Normal range of motion. Neck supple. No tracheal deviation present. No thyromegaly present.  Cardiovascular: Normal rate, regular rhythm, normal heart sounds and intact distal pulses.  Exam reveals no gallop and no friction rub.   No murmur  heard. Respiratory: Effort normal and breath sounds normal. No respiratory distress. She has no wheezes. She has no rales. She exhibits no tenderness.  GI: Soft. Bowel sounds are normal. She exhibits no distension and no mass. There is tenderness. There is no rebound and no guarding.  Genitourinary:       Vulva is normal without lesions Vagina is pink moist without discharge Cervix normal in appearance and pap is normal Uterus is normal size Adnexa is negative with normal sized ovaries by sonogram  Musculoskeletal: Normal range of motion. She exhibits no edema and no tenderness.  Neurological: She is alert and oriented to person, place, and time. She has normal reflexes. She displays normal reflexes. No cranial nerve deficit. She exhibits normal muscle tone. Coordination normal.  Skin: Skin is warm and dry. No rash noted. No erythema. No pallor.  Psychiatric: She has a normal mood and affect. Her behavior is normal. Judgment and thought content normal.    Labs: Results for orders placed during the hospital encounter of 01/11/14 (from the past 336 hour(s))  URINALYSIS, ROUTINE W REFLEX MICROSCOPIC   Collection Time    01/11/14 11:16 AM      Result Value Ref Range   Color, Urine YELLOW  YELLOW   APPearance CLEAR  CLEAR   Specific Gravity, Urine 1.010  1.005 - 1.030   pH 6.0  5.0 - 8.0   Glucose, UA NEGATIVE  NEGATIVE mg/dL   Hgb urine dipstick TRACE (*) NEGATIVE   Bilirubin Urine NEGATIVE  NEGATIVE   Ketones, ur NEGATIVE  NEGATIVE mg/dL   Protein, ur NEGATIVE  NEGATIVE mg/dL   Urobilinogen, UA 0.2  0.0 - 1.0 mg/dL   Nitrite NEGATIVE  NEGATIVE   Leukocytes, UA NEGATIVE  NEGATIVE  URINE MICROSCOPIC-ADD ON   Collection Time    01/11/14 11:16 AM      Result Value Ref Range   WBC, UA 0-2  <3 WBC/hpf   RBC / HPF 0-2  <3 RBC/hpf  CBC   Collection Time    01/11/14 11:17 AM      Result Value Ref Range   WBC 5.5  4.0 - 10.5 K/uL   RBC 4.90  3.87 - 5.11 MIL/uL   Hemoglobin 12.1  12.0  - 15.0 g/dL   HCT 16.136.5  09.636.0 - 04.546.0 %   MCV 74.5 (*) 78.0 - 100.0 fL   MCH 24.7 (*) 26.0 - 34.0 pg   MCHC 33.2  30.0 - 36.0 g/dL   RDW 40.915.3  81.111.5 - 91.415.5 %   Platelets 263  150 - 400 K/uL  COMPREHENSIVE METABOLIC PANEL   Collection Time    01/11/14 11:17 AM      Result Value Ref Range   Sodium 140  137 - 147 mEq/L   Potassium 4.1  3.7 - 5.3 mEq/L   Chloride 103  96 - 112 mEq/L   CO2 23  19 - 32 mEq/L   Glucose, Bld 108 (*) 70 - 99 mg/dL   BUN 13  6 - 23 mg/dL   Creatinine, Ser 7.820.66  0.50 - 1.10 mg/dL   Calcium 9.4  8.4 - 95.610.5 mg/dL  Total Protein 7.3  6.0 - 8.3 g/dL   Albumin 3.8  3.5 - 5.2 g/dL   AST 14  0 - 37 U/L   ALT 12  0 - 35 U/L   Alkaline Phosphatase 61  39 - 117 U/L   Total Bilirubin 0.5  0.3 - 1.2 mg/dL   GFR calc non Af Amer >90  >90 mL/min   GFR calc Af Amer >90  >90 mL/min  HCG, QUANTITATIVE, PREGNANCY   Collection Time    01/11/14 11:17 AM      Result Value Ref Range   hCG, Beta Chain, Quant, S <1  <5 mIU/mL  Results for orders placed in visit on 01/08/14 (from the past 336 hour(s))  POCT HEMOGLOBIN   Collection Time    01/08/14 12:26 PM      Result Value Ref Range   Hemoglobin 12.6  12.2 - 16.2 g/dL    EKG: No orders found for this or any previous visit.  Imaging Studies: No results found.    Assessment: Patient Active Problem List   Diagnosis Date Noted  . Excessive or frequent menstruation 01/08/2014  dysmenorrhea  Plan: Hysteroscopy uterine curettage and endometrial ablation  Lazaro Arms 01/16/2014 8:20 AM

## 2014-01-16 NOTE — Telephone Encounter (Signed)
Patient already had surgery closing encounter

## 2014-01-16 NOTE — Anesthesia Postprocedure Evaluation (Signed)
  Anesthesia Post-op Note  Patient: Monica Arnold  Procedure(s) Performed: Procedure(s) with comments: DILATATION & CURETTAGE/HYSTEROSCOPY WITH THERMACHOICE ABLATION (N/A) - total time 11 minutes and 2 seconds; total D5 in 57ml, total out 57ml; 86-88 degrees celcius  Patient Location: PACU  Anesthesia Type:General  Level of Consciousness: awake, alert , oriented and patient cooperative  Airway and Oxygen Therapy: Patient Spontanous Breathing and Patient connected to nasal cannula oxygen  Post-op Pain: mild  Post-op Assessment: Post-op Vital signs reviewed, Patient's Cardiovascular Status Stable, Respiratory Function Stable, Patent Airway, No signs of Nausea or vomiting and Adequate PO intake  Post-op Vital Signs: Reviewed and stable  Last Vitals:  Filed Vitals:   01/16/14 0953  BP: 87/63  Pulse: 99  Temp: 36.8 C  Resp: 13    Complications: No apparent anesthesia complications

## 2014-01-16 NOTE — Anesthesia Preprocedure Evaluation (Signed)
Anesthesia Evaluation  Patient identified by MRN, date of birth, ID band Patient awake    Reviewed: Allergy & Precautions, H&P , NPO status , Patient's Chart, lab work & pertinent test results  History of Anesthesia Complications Negative for: history of anesthetic complications  Airway Mallampati: II TM Distance: >3 FB Neck ROM: Full    Dental  (+) Teeth Intact, Dental Advisory Given   Pulmonary Current Smoker (sinus),  breath sounds clear to auscultation        Cardiovascular negative cardio ROS  Rhythm:Regular Rate:Normal     Neuro/Psych    GI/Hepatic negative GI ROS,   Endo/Other    Renal/GU      Musculoskeletal   Abdominal   Peds  Hematology   Anesthesia Other Findings   Reproductive/Obstetrics                           Anesthesia Physical Anesthesia Plan  ASA: II  Anesthesia Plan: General   Post-op Pain Management:    Induction: Intravenous  Airway Management Planned: LMA  Additional Equipment:   Intra-op Plan:   Post-operative Plan: Extubation in OR  Informed Consent: I have reviewed the patients History and Physical, chart, labs and discussed the procedure including the risks, benefits and alternatives for the proposed anesthesia with the patient or authorized representative who has indicated his/her understanding and acceptance.     Plan Discussed with:   Anesthesia Plan Comments:         Anesthesia Quick Evaluation

## 2014-01-16 NOTE — Transfer of Care (Signed)
Immediate Anesthesia Transfer of Care Note  Patient: Monica Arnold  Procedure(s) Performed: Procedure(s) with comments: DILATATION & CURETTAGE/HYSTEROSCOPY WITH THERMACHOICE ABLATION (N/A) - total time 11 minutes and 2 seconds; total D5 in 57ml, total out 57ml; 86-88 degrees celcius  Patient Location: PACU  Anesthesia Type:General  Level of Consciousness: awake, alert , oriented and patient cooperative  Airway & Oxygen Therapy: Patient Spontanous Breathing and Patient connected to nasal cannula oxygen  Post-op Assessment: Report given to PACU RN and Post -op Vital signs reviewed and stable  Post vital signs: Reviewed and stable  Complications: No apparent anesthesia complications

## 2014-01-16 NOTE — Op Note (Signed)
Preoperative diagnosis: Menometrorrhagia                                        Dysmenorrhea   Postoperative diagnoses: Same as above   Procedure: Hysteroscopy, uterine curettage, endometrial ablation  Surgeon: Despina HiddenEure MD  Anesthesia: Laryngeal mask airway  Findings: The endometrium was normal although there was a great deal of decidualized endometrial tissue. There were no fibroid or other abnormalities.  Description of operation: The patient was taken to the operating room and placed in the supine position. She underwent general anesthesia using the laryngeal mask airway. She was placed in the dorsal lithotomy position and prepped and draped in the usual sterile fashion. A Graves speculum was placed and the anterior cervical lip was grasped with a single-tooth tenaculum. The cervix was dilated serially to allow passage of the hysteroscope. Diagnostic hysteroscopy was performed and was found to be normal. A vigorous uterine curettage was then performed and all tissue sent to pathology for evaluation. The ThermaChoice 3 endometrial ablation balloon was then used were 57 cc of D5W was required to maintain a pressure of 190-200 mm of mercury throughout the procedure. Toatl therapy time was 11:02.  All of the equipment worked well throughout the procedure. All of the fluid was returned at the end of the procedure. The patient was awakened from anesthesia and taken to the recovery room in good stable condition all counts were correct. She received 2 g of Ancef and 30 mg of Toradol preoperatively. She will be discharged from the recovery room and followed up in the office in 1- 2 weeks.  Lazaro ArmsLuther H Eure 01/16/2014 9:46 AM

## 2014-01-16 NOTE — Anesthesia Procedure Notes (Signed)
Procedure Name: LMA Insertion Date/Time: 01/16/2014 8:57 AM Performed by: Pernell DupreADAMS, Monica Corliss A Pre-anesthesia Checklist: Patient identified, Timeout performed, Emergency Drugs available, Suction available and Patient being monitored Patient Re-evaluated:Patient Re-evaluated prior to inductionOxygen Delivery Method: Circle system utilized Preoxygenation: Pre-oxygenation with 100% oxygen Intubation Type: IV induction Ventilation: Mask ventilation without difficulty LMA: LMA inserted LMA Size: 4.0 Number of attempts: 1 Placement Confirmation: positive ETCO2 and breath sounds checked- equal and bilateral Tube secured with: Tape Dental Injury: Teeth and Oropharynx as per pre-operative assessment

## 2014-01-16 NOTE — Progress Notes (Signed)
Awake. C/O nausea. No emesis. Med as noted. 

## 2014-01-16 NOTE — Discharge Instructions (Signed)

## 2014-01-17 ENCOUNTER — Encounter (HOSPITAL_COMMUNITY): Payer: Self-pay | Admitting: Obstetrics & Gynecology

## 2014-01-23 ENCOUNTER — Encounter: Payer: Self-pay | Admitting: Obstetrics & Gynecology

## 2014-01-23 ENCOUNTER — Ambulatory Visit (INDEPENDENT_AMBULATORY_CARE_PROVIDER_SITE_OTHER): Payer: Self-pay | Admitting: Obstetrics & Gynecology

## 2014-01-23 VITALS — BP 110/80 | Wt 165.0 lb

## 2014-01-23 DIAGNOSIS — Z9889 Other specified postprocedural states: Secondary | ICD-10-CM

## 2014-01-23 NOTE — Progress Notes (Signed)
Patient ID: Monica Arnold, female   DOB: 01-27-1976, 38 y.o.   MRN: [id number] POD#8 hysteroscopy uterine curettage endometrial ablation Doing well Bloated Cramping Voiding ok Some watery blood tinged discharge  No burning with urination, frequency or urgency No nausea, vomiting or diarrhea Nor fever chills or other constitutional symptoms   Exam Minimal watery discharge Uterus retroverted normal post op tenderness  normal post op course Follow up prn

## 2014-02-05 ENCOUNTER — Telehealth: Payer: Self-pay | Admitting: Obstetrics & Gynecology

## 2014-02-05 NOTE — Telephone Encounter (Signed)
Spoke with pt. Pt had a D&C with ablation on 5/20. Yesterday she started with a yellow discharge with some odor. Does she need to be seen? Please advise. Thanks!!! [employer]

## 2014-02-06 ENCOUNTER — Encounter (HOSPITAL_BASED_OUTPATIENT_CLINIC_OR_DEPARTMENT_OTHER): Payer: Self-pay | Admitting: Emergency Medicine

## 2014-02-06 ENCOUNTER — Emergency Department (HOSPITAL_BASED_OUTPATIENT_CLINIC_OR_DEPARTMENT_OTHER)
Admission: EM | Admit: 2014-02-06 | Discharge: 2014-02-06 | Disposition: A | Discharge status: Home / Self Care | Payer: No Typology Code available for payment source | Attending: Emergency Medicine | Admitting: Emergency Medicine

## 2014-02-06 DIAGNOSIS — F17200 Nicotine dependence, unspecified, uncomplicated: Secondary | ICD-10-CM | POA: Insufficient documentation

## 2014-02-06 DIAGNOSIS — Z3202 Encounter for pregnancy test, result negative: Secondary | ICD-10-CM | POA: Insufficient documentation

## 2014-02-06 DIAGNOSIS — N898 Other specified noninflammatory disorders of vagina: Secondary | ICD-10-CM | POA: Insufficient documentation

## 2014-02-06 LAB — WET PREP, GENITAL
Clue Cells Wet Prep HPF POC: NONE SEEN
TRICH WET PREP: NONE SEEN
Yeast Wet Prep HPF POC: NONE SEEN

## 2014-02-06 LAB — URINALYSIS, ROUTINE W REFLEX MICROSCOPIC
Bilirubin Urine: NEGATIVE
Glucose, UA: NEGATIVE mg/dL
Hgb urine dipstick: NEGATIVE
Ketones, ur: NEGATIVE mg/dL
LEUKOCYTES UA: NEGATIVE
NITRITE: NEGATIVE
Protein, ur: NEGATIVE mg/dL
SPECIFIC GRAVITY, URINE: 1.024 (ref 1.005–1.030)
Urobilinogen, UA: 0.2 mg/dL (ref 0.0–1.0)
pH: 6 (ref 5.0–8.0)

## 2014-02-06 LAB — PREGNANCY, URINE: Preg Test, Ur: NEGATIVE

## 2014-02-06 MED ORDER — CEFTRIAXONE SODIUM 250 MG IJ SOLR
250.0000 mg | Freq: Once | INTRAMUSCULAR | Status: AC
  Administered 2014-02-06: 250 mg via INTRAMUSCULAR
  Filled 2014-02-06: qty 250

## 2014-02-06 MED ORDER — DOXYCYCLINE HYCLATE 100 MG PO CAPS: 100.0000 mg | ORAL_CAPSULE | Freq: Two times a day (BID) | ORAL | Status: DC

## 2014-02-06 MED ORDER — AZITHROMYCIN 250 MG PO TABS
1000.0000 mg | ORAL_TABLET | Freq: Every day | ORAL | Status: DC
  Administered 2014-02-06: 1000 mg via ORAL

## 2014-02-06 MED ORDER — AZITHROMYCIN 250 MG PO TABS
ORAL_TABLET | ORAL | Status: AC
  Filled 2014-02-06: qty 4

## 2014-02-06 NOTE — ED Notes (Signed)
Pt reports may 20th d&C , pain return Sunday and change in vaginal discharge x 2 days

## 2014-02-06 NOTE — ED Provider Notes (Signed)
CSN: [id number]     Arrival date & time 02/06/14  1734 History   First MD Initiated Contact with Patient 02/06/14 1745     Chief Complaint  Patient presents with  . Abdominal Cramping     (Consider location/radiation/quality/duration/timing/severity/associated sxs/prior Treatment) Patient is a 38 y.o. female presenting with vaginal discharge. The history is provided by the patient. No language interpreter was used.  Vaginal Discharge Quality:  Manson PasseyBrown Severity:  Moderate Onset quality:  Gradual Timing:  Constant Progression:  Worsening Chronicity:  New Relieved by:  Nothing Worsened by:  Nothing tried Ineffective treatments:  None tried Pt reports she had a d and c on  May 20th.   Pt now has a brown vaginal discharge.  Pt reports some abdominal cramping  Past Medical History  Diagnosis Date  . Back pain    Past Surgical History  Procedure Laterality Date  . Cesarean section    . Tubal ligation    . Dilitation & currettage/hystroscopy with thermachoice ablation N/A 01/16/2014    Procedure: DILATATION & CURETTAGE/HYSTEROSCOPY WITH THERMACHOICE ABLATION;  Surgeon: Lazaro ArmsLuther H Eure, MD;  Location: [facility];  Service: Gynecology;  Laterality: N/A;  total time 11 minutes and 2 seconds; total D5 in 57ml, total out 57ml; 86-88 degrees celcius   Family History  Problem Relation Age of Onset  . Heart disease Mother   . Heart disease Father    History  Substance Use Topics  . Smoking status: Current Every Day Smoker -- 0.75 packs/day for 20 years    Types: Cigarettes  . Smokeless tobacco: Not on file  . Alcohol Use: No   OB History   Grav Para Term Preterm Abortions TAB SAB Ect Mult Living                 Review of Systems  Genitourinary: Positive for vaginal discharge.  All other systems reviewed and are negative.     Allergies  Sulfa antibiotics  Home Medications   Prior to Admission medications   Medication Sig Start Date End Date Taking? Authorizing Provider   HYDROcodone-acetaminophen (NORCO/VICODIN) 5-325 MG per tablet Take 1 tablet by mouth every 6 (six) hours as needed. 01/16/14   Lazaro ArmsLuther H Eure, MD  ibuprofen (ADVIL,MOTRIN) 600 MG tablet Take 1 tablet (600 mg total) by mouth every 6 (six) hours as needed. 09/09/13   Hope Orlene OchM Neese, NP  ketorolac (TORADOL) 10 MG tablet Take 1 tablet (10 mg total) by mouth every 8 (eight) hours as needed. 01/16/14   Lazaro ArmsLuther H Eure, MD  ondansetron (ZOFRAN) 8 MG tablet Take 1 tablet (8 mg total) by mouth every 8 (eight) hours as needed for nausea. 01/16/14   Lazaro ArmsLuther H Eure, MD   BP 124/92  Pulse 105  Temp(Src) 97.8 F (36.6 C)  Resp 20  SpO2 99%  LMP 12/12/2013 Physical Exam  Nursing note and vitals reviewed. Constitutional: She is oriented to person, place, and time. She appears well-developed and well-nourished.  HENT:  Head: Normocephalic and atraumatic.  Eyes: Pupils are equal, round, and reactive to light.  Neck: Normal range of motion.  Cardiovascular: Normal rate.   Pulmonary/Chest: Effort normal.  Abdominal: Soft.  Genitourinary: Vaginal discharge found.  Thick yellow discharge.  Adnexa nontender  Musculoskeletal: Normal range of motion.  Neurological: She is alert and oriented to person, place, and time. She has normal reflexes.  Skin: Skin is warm.  Psychiatric: She has a normal mood and affect.    ED Course  Procedures (including critical  care time) Labs Review Labs Reviewed  WET PREP, GENITAL - Abnormal; Notable for the following:    WBC, Wet Prep HPF POC FEW (*)    All other components within normal limits  GC/CHLAMYDIA PROBE AMP  URINALYSIS, ROUTINE W REFLEX MICROSCOPIC  PREGNANCY, URINE    Imaging Review No results found.   EKG Interpretation None      MDM   Final diagnoses:  None    Rocephin zithromax   Rx for doxycycline Call Dr. Despina Hidden to be seen for evaluation    Elson Areas, PA-C 02/06/14 2040

## 2014-02-07 ENCOUNTER — Telehealth: Payer: Self-pay | Admitting: Obstetrics & Gynecology

## 2014-02-07 LAB — GC/CHLAMYDIA PROBE AMP
CT Probe RNA: NEGATIVE
GC Probe RNA: NEGATIVE

## 2014-02-07 MED ORDER — METRONIDAZOLE 500 MG PO TABS: 500.0000 mg | ORAL_TABLET | Freq: Two times a day (BID) | ORAL | Status: DC

## 2014-02-07 NOTE — ED Provider Notes (Signed)
Medical screening examination/treatment/procedure(s) were performed by non-physician practitioner and as supervising physician I was immediately available for consultation/collaboration.   EKG Interpretation None        Dagmar Hait, MD 02/07/14 [phone]

## 2014-02-07 NOTE — Telephone Encounter (Signed)
Pt states went [facility] and was given abx and rocephin injection. Pt states went there because had not heard back from our office. Pt requesting medication, Hydrocodone,  for pelvic pressure, "cervics cramping."

## 2014-02-18 NOTE — Telephone Encounter (Signed)
Sent to Dr. Despina HiddenEure on 02-05-14 to advise.

## 2014-02-26 ENCOUNTER — Telehealth: Payer: Self-pay | Admitting: Obstetrics & Gynecology

## 2014-02-26 NOTE — Telephone Encounter (Signed)
Pt states having heavy bleeding x 3 weeks, fatigue, weak. Pt states afraid she is going to lose her job due to missing work due to fatigue.  Pt states had endometrial ablation on 01/16/2014. Appt made for tomorrow at 1:30 pm.

## 2014-02-27 ENCOUNTER — Encounter: Payer: Self-pay | Admitting: Obstetrics & Gynecology

## 2014-02-27 ENCOUNTER — Ambulatory Visit (INDEPENDENT_AMBULATORY_CARE_PROVIDER_SITE_OTHER): Payer: Self-pay | Admitting: Obstetrics & Gynecology

## 2014-02-27 VITALS — BP 110/84 | Ht 66.0 in | Wt 166.0 lb

## 2014-02-27 DIAGNOSIS — N939 Abnormal uterine and vaginal bleeding, unspecified: Secondary | ICD-10-CM

## 2014-02-27 DIAGNOSIS — N920 Excessive and frequent menstruation with regular cycle: Secondary | ICD-10-CM

## 2014-02-27 DIAGNOSIS — N898 Other specified noninflammatory disorders of vagina: Secondary | ICD-10-CM

## 2014-02-27 LAB — POCT HEMOGLOBIN: Hemoglobin: 12.4 g/dL (ref 12.2–16.2)

## 2014-02-27 MED ORDER — MEGESTROL ACETATE 40 MG PO TABS: 40.0000 mg | ORAL_TABLET | Freq: Every day | ORAL | Status: DC

## 2014-02-27 MED ORDER — DOXYCYCLINE HYCLATE 100 MG PO CAPS: 100.0000 mg | ORAL_CAPSULE | Freq: Two times a day (BID) | ORAL | Status: DC

## 2014-02-27 NOTE — Progress Notes (Signed)
Patient ID: Monica Arnold, female   DOB: Jul 25, 1976, 38 y.o.   MRN: [id number] Chief Complaint  Patient presents with  . c/o heavy vaginal bleeding, fatigue, weak    endometrial ablation 01/16/2014    HPI Post op 5 weeks from endometrial ablation Since 02/16/2014 began having heavy heavy vaginal bleeding "used a grocery bag of pads" Cramping associated but not nearly as bad and clots not nearly as big  ROS No burning with urination, frequency or urgency No nausea, vomiting or diarrhea Nor fever chills or other constitutional symptoms   Blood pressure 110/84, height 5\' 6"  (1.676 m), weight 166 lb (75.297 kg), last menstrual period 02/01/2014.  EXAM Abdomen:      soft, nontender Vulva:            normal appearing vulva with no masses, tenderness or lesions Vagina:          normal mucosa, no discharge, minimal brownish blood Cervix:           normal appearance Uterus:          uterus is normal size, shape, consistency and nontender Adnexa:         normal adnexa in size, nontender and no masses Rectal:           deferred, not clinically indicated Hemoccult:    was not performed                          Assessment/Plan:  Post ablation menorrhagia in the first few weeks: likely represents deeper level of tissue sloughing Will manage with doxycycline 100 mg and megestrol  Follow up 1 month

## 2014-03-15 ENCOUNTER — Emergency Department (HOSPITAL_BASED_OUTPATIENT_CLINIC_OR_DEPARTMENT_OTHER)
Admission: EM | Admit: 2014-03-15 | Discharge: 2014-03-15 | Disposition: A | Discharge status: Home / Self Care | Payer: Self-pay | Attending: Emergency Medicine | Admitting: Emergency Medicine

## 2014-03-15 ENCOUNTER — Encounter (HOSPITAL_BASED_OUTPATIENT_CLINIC_OR_DEPARTMENT_OTHER): Payer: Self-pay | Admitting: Emergency Medicine

## 2014-03-15 ENCOUNTER — Emergency Department (HOSPITAL_BASED_OUTPATIENT_CLINIC_OR_DEPARTMENT_OTHER): Payer: No Typology Code available for payment source

## 2014-03-15 DIAGNOSIS — Z792 Long term (current) use of antibiotics: Secondary | ICD-10-CM | POA: Insufficient documentation

## 2014-03-15 DIAGNOSIS — Z3202 Encounter for pregnancy test, result negative: Secondary | ICD-10-CM | POA: Insufficient documentation

## 2014-03-15 DIAGNOSIS — F17200 Nicotine dependence, unspecified, uncomplicated: Secondary | ICD-10-CM | POA: Insufficient documentation

## 2014-03-15 DIAGNOSIS — N83209 Unspecified ovarian cyst, unspecified side: Secondary | ICD-10-CM | POA: Insufficient documentation

## 2014-03-15 DIAGNOSIS — N83202 Unspecified ovarian cyst, left side: Secondary | ICD-10-CM

## 2014-03-15 DIAGNOSIS — Z79899 Other long term (current) drug therapy: Secondary | ICD-10-CM | POA: Insufficient documentation

## 2014-03-15 LAB — WET PREP, GENITAL
TRICH WET PREP: NONE SEEN
YEAST WET PREP: NONE SEEN

## 2014-03-15 LAB — CBC WITH DIFFERENTIAL/PLATELET
Basophils Absolute: 0 10*3/uL (ref 0.0–0.1)
Basophils Relative: 0 % (ref 0–1)
EOS PCT: 2 % (ref 0–5)
Eosinophils Absolute: 0.1 10*3/uL (ref 0.0–0.7)
HCT: 37 % (ref 36.0–46.0)
Hemoglobin: 12.4 g/dL (ref 12.0–15.0)
LYMPHS ABS: 0.8 10*3/uL (ref 0.7–4.0)
LYMPHS PCT: 17 % (ref 12–46)
MCH: 25.9 pg — ABNORMAL LOW (ref 26.0–34.0)
MCHC: 33.5 g/dL (ref 30.0–36.0)
MCV: 77.4 fL — AB (ref 78.0–100.0)
Monocytes Absolute: 0.5 10*3/uL (ref 0.1–1.0)
Monocytes Relative: 10 % (ref 3–12)
Neutro Abs: 3.5 10*3/uL (ref 1.7–7.7)
Neutrophils Relative %: 71 % (ref 43–77)
Platelets: 221 10*3/uL (ref 150–400)
RBC: 4.78 MIL/uL (ref 3.87–5.11)
RDW: 14.9 % (ref 11.5–15.5)
WBC: 4.9 10*3/uL (ref 4.0–10.5)

## 2014-03-15 LAB — URINALYSIS, ROUTINE W REFLEX MICROSCOPIC
Bilirubin Urine: NEGATIVE
GLUCOSE, UA: NEGATIVE mg/dL
HGB URINE DIPSTICK: NEGATIVE
Ketones, ur: NEGATIVE mg/dL
Leukocytes, UA: NEGATIVE
Nitrite: NEGATIVE
Protein, ur: NEGATIVE mg/dL
Specific Gravity, Urine: 1.011 (ref 1.005–1.030)
Urobilinogen, UA: 0.2 mg/dL (ref 0.0–1.0)
pH: 7 (ref 5.0–8.0)

## 2014-03-15 LAB — BASIC METABOLIC PANEL
Anion gap: 12 (ref 5–15)
BUN: 12 mg/dL (ref 6–23)
CO2: 25 meq/L (ref 19–32)
Calcium: 9.5 mg/dL (ref 8.4–10.5)
Chloride: 104 mEq/L (ref 96–112)
Creatinine, Ser: 0.8 mg/dL (ref 0.50–1.10)
GFR calc Af Amer: >90 mL/min (ref >90)
GFR calc non Af Amer: >90 mL/min (ref >90)
GLUCOSE: 97 mg/dL (ref 70–99)
Potassium: 4 mEq/L (ref 3.7–5.3)
SODIUM: 141 meq/L (ref 137–147)

## 2014-03-15 LAB — PREGNANCY, URINE: PREG TEST UR: NEGATIVE

## 2014-03-15 MED ORDER — IBUPROFEN 800 MG PO TABS: 800.0000 mg | ORAL_TABLET | Freq: Three times a day (TID) | ORAL | Status: DC

## 2014-03-15 MED ORDER — HYDROCODONE-ACETAMINOPHEN 5-325 MG PO TABS
1.0000 | ORAL_TABLET | Freq: Once | ORAL | Status: AC
  Administered 2014-03-15: 1 via ORAL
  Filled 2014-03-15: qty 1

## 2014-03-15 MED ORDER — HYDROCODONE-ACETAMINOPHEN 5-325 MG PO TABS: 1.0000 | ORAL_TABLET | ORAL | Status: DC | PRN

## 2014-03-15 NOTE — ED Notes (Signed)
Abdominal pain since 9am.  Ablasion in May. She has been on and off since that time. Has had vaginal bleeding since.

## 2014-03-15 NOTE — Discharge Instructions (Signed)
Ovarian Cyst An ovarian cyst is a fluid-filled sac that forms on an ovary. The ovaries are small organs that produce eggs in women. Various types of cysts can form on the ovaries. Most are not cancerous. Many do not cause problems, and they often go away on their own. Some may cause symptoms and require treatment. Common types of ovarian cysts include:  Functional cysts--These cysts may occur every month during the menstrual cycle. This is normal. The cysts usually go away with the next menstrual cycle if the woman does not get pregnant. Usually, there are no symptoms with a functional cyst.  Endometrioma cysts--These cysts form from the tissue that lines the uterus. They are also called "chocolate cysts" because they become filled with blood that turns [place]. This type of cyst can cause pain in the lower abdomen during intercourse and with your menstrual period.  Cystadenoma cysts--This type develops from the cells on the outside of the ovary. These cysts can get very big and cause lower abdomen pain and pain with intercourse. This type of cyst can twist on itself, cut off its blood supply, and cause severe pain. It can also easily rupture and cause a lot of pain.  Dermoid cysts--This type of cyst is sometimes found in both ovaries. These cysts may contain different kinds of body tissue, such as skin, teeth, hair, or cartilage. They usually do not cause symptoms unless they get very big.  Theca lutein cysts--These cysts occur when too much of a certain hormone (human chorionic gonadotropin) is produced and overstimulates the ovaries to produce an egg. This is most common after procedures used to assist with the conception of a baby (in vitro fertilization). CAUSES   Fertility drugs can cause a condition in which multiple large cysts are formed on the ovaries. This is called ovarian hyperstimulation syndrome.  A condition called polycystic ovary syndrome can cause hormonal imbalances that can lead to  nonfunctional ovarian cysts. SIGNS AND SYMPTOMS  Many ovarian cysts do not cause symptoms. If symptoms are present, they may include:  Pelvic pain or pressure.  Pain in the lower abdomen.  Pain during sexual intercourse.  Increasing girth (swelling) of the abdomen.  Abnormal menstrual periods.  Increasing pain with menstrual periods.  Stopping having menstrual periods without being pregnant. DIAGNOSIS  These cysts are commonly found during a routine or annual pelvic exam. Tests may be ordered to find out more about the cyst. These tests may include:  Ultrasound.  X-ray of the pelvis.  CT scan.  MRI.  Blood tests. TREATMENT  Many ovarian cysts go away on their own without treatment. Your health care provider may want to check your cyst regularly for 2-3 months to see if it changes. For women in menopause, it is particularly important to monitor a cyst closely because of the higher rate of ovarian cancer in menopausal women. When treatment is needed, it may include any of the following:  A procedure to drain the cyst (aspiration). This may be done using a long needle and ultrasound. It can also be done through a laparoscopic procedure. This involves using a thin, lighted tube with a tiny camera on the end (laparoscope) inserted through a small incision.  Surgery to remove the whole cyst. This may be done using laparoscopic surgery or an open surgery involving a larger incision in the lower abdomen.  Hormone treatment or birth control pills. These methods are sometimes used to help dissolve a cyst. HOME CARE INSTRUCTIONS   Only take over-the-counter  or prescription medicines as directed by your health care provider.  Follow up with your health care provider as directed.  Get regular pelvic exams and Pap tests. SEEK MEDICAL CARE IF:   Your periods are late, irregular, or painful, or they stop.  Your pelvic pain or abdominal pain does not go away.  Your abdomen becomes  larger or swollen.  You have pressure on your bladder or trouble emptying your bladder completely.  You have pain during sexual intercourse.  You have feelings of fullness, pressure, or discomfort in your stomach.  You lose weight for no apparent reason.  You feel generally ill.  You become constipated.  You lose your appetite.  You develop acne.  You have an increase in body and facial hair.  You are gaining weight, without changing your exercise and eating habits.  You think you are pregnant. SEEK IMMEDIATE MEDICAL CARE IF:   You have increasing abdominal pain.  You feel sick to your stomach (nauseous), and you throw up (vomit).  You develop a fever that comes on suddenly.  You have abdominal pain during a bowel movement.  Your menstrual periods become heavier than usual. MAKE SURE YOU:  Understand these instructions.  Will watch your condition.  Will get help right away if you are not doing well or get worse. Document Released: 08/16/2005 Document Revised: 08/21/2013 Document Reviewed: 04/23/2013 [facility] Patient Information 2015 [employer] [place]. This information is not intended to replace advice given to you by your health care provider. Make sure you discuss any questions you have with your health care provider. Heat Therapy Heat therapy can help ease achy, tense, stiff, and tight muscles and joints. Heat should not be used on new injuries. Wait at least 48 hours after the injury before using heat therapy. Heat also should not be used for discomfort or pain that occurs right after doing an activity. If you still have pain or stiffness 3 hours after finishing the activity, then heat therapy may be used. PRECAUTIONS  High heat or prolonged exposure to heat can cause burns. Be careful when using heat therapy to avoid burning your skin. If you have any of the following conditions, do not use heat until you have discussed heat therapy with your caregiver:  Poor  circulation.  Healing wounds or scarred skin in the area being treated.  Diabetes, heart disease, or high blood pressure.  Numbness of the area being treated.  Unusual swelling of the area being treated.  Active infections.  Blood clots.  Cancer.  Inability to communicate your response to pain. This can include young children and people with dementia. HOME CARE INSTRUCTIONS Moist heat pack  Soak a clean towel in warm water, and squeeze out the extra water. The water temperature should be comfortable to the skin.  Put the warm, wet towel in a plastic bag.  Place a thin, dry towel between your skin and the bag.  Put the heat pack on the area for 5 minutes, and check your skin. Your skin may be pink, but it should not be red.  Leave the heat pack on the area for a total of 15 to 30 minutes.  Repeat this every 2 to 4 hours while awake. Do not use heat while you are sleeping. Warm water bath  Fill a tub with warm water. The water temperature should be comfortable to the skin.  Place the affected body part in the tub.  Soak the area for 20 to 40 minutes.  Repeat as  needed. Hot water bottle  Fill the water bottle half full with hot water.  Press out the extra air. Close the cap tightly.  Place a dry towel between your skin and the bottle.  Put the bottle on the area for 5 minutes, and check your skin. Your skin may be pink, but it should not be red.  Leave the bottle on the area for a total of 15 to 30 minutes.  Repeat this every 2 to 4 hours while awake. Electric heating pad  Place a dry towel between your skin and the heating pad.  Set the heating pad on low heat.  Put the heating pad on the area for 10 minutes, and check your skin. Your skin may be pink, but it should not be red.  Leave the heating pad on the area for a total of 20 to 40 minutes.  Repeat this every 2 to 4 hours while awake.  Do not lie on the heating pad.  Do not fall asleep while using  the heating pad.  Do not use the heating pad near water. Contact with water can result in an electrical shock. SEEK MEDICAL CARE IF:  You have blisters, redness, swelling, or numbness.  You have any new problems.  Your problems are getting worse.  You have any questions or concerns. If you develop any problems, stop using heat therapy until you see your caregiver. MAKE SURE YOU:  Understand these instructions.  Will watch your condition.  Will get help right away if you are not doing well or get worse. Document Released: 11/08/2011 Document Reviewed: 11/08/2011 [facility] Patient Information 2015 [place], [place]. This information is not intended to replace advice given to you by your health care provider. Make sure you discuss any questions you have with your health care provider.

## 2014-03-15 NOTE — ED Notes (Signed)
Patient transported to Ultrasound 

## 2014-03-15 NOTE — ED Provider Notes (Signed)
CSN: [id number]     Arrival date & time 03/15/14  1238 History   First MD Initiated Contact with Patient 03/15/14 1241     Chief Complaint  Patient presents with  . Abdominal Pain     (Consider location/radiation/quality/duration/timing/severity/associated sxs/prior Treatment) HPI Comments: Pelvic pain and cramping since this morning. She reports a difficulty gynecologic course since an ablation in May of this year to include recurrent pelvic pain and heavy vaginal bleeding. She had a post-procedure infection but reports clearing of symptoms with abx rx's which ended last week. No fever, N, V, D, dysuria. She denies current bleeding and denies vaginal discharge.   The history is provided by the patient. No language interpreter was used.    Past Medical History  Diagnosis Date  . Back pain    Past Surgical History  Procedure Laterality Date  . Cesarean section    . Tubal ligation    . Dilitation & currettage/hystroscopy with thermachoice ablation N/A 01/16/2014    Procedure: DILATATION & CURETTAGE/HYSTEROSCOPY WITH THERMACHOICE ABLATION;  Surgeon: Lazaro Arms, MD;  Location: [facility];  Service: Gynecology;  Laterality: N/A;  total time 11 minutes and 2 seconds; total D5 in 57ml, total out 57ml; 86-88 degrees celcius   Family History  Problem Relation Age of Onset  . Heart disease Mother   . Heart disease Father    History  Substance Use Topics  . Smoking status: Current Every Day Smoker -- 0.75 packs/day for 20 years    Types: Cigarettes  . Smokeless tobacco: Never Used  . Alcohol Use: No   OB History   Grav Para Term Preterm Abortions TAB SAB Ect Mult Living                 Review of Systems  Constitutional: Negative for fever.  Gastrointestinal: Negative for vomiting, abdominal pain and constipation.  Genitourinary: Positive for pelvic pain. Negative for dysuria, vaginal bleeding and vaginal discharge.  Musculoskeletal: Negative for back pain.      Allergies   Clindamycin/lincomycin and Sulfa antibiotics  Home Medications   Prior to Admission medications   Medication Sig Start Date End Date Taking? Authorizing Provider  doxycycline (VIBRAMYCIN) 100 MG capsule Take 1 capsule (100 mg total) by mouth 2 (two) times daily. 02/27/14   Lazaro Arms, MD  HYDROcodone-acetaminophen (NORCO/VICODIN) 5-325 MG per tablet Take 1 tablet by mouth every 6 (six) hours as needed. 01/16/14   Lazaro Arms, MD  ibuprofen (ADVIL,MOTRIN) 600 MG tablet Take 1 tablet (600 mg total) by mouth every 6 (six) hours as needed. 09/09/13   Hope Orlene Och, NP  ketorolac (TORADOL) 10 MG tablet Take 1 tablet (10 mg total) by mouth every 8 (eight) hours as needed. 01/16/14   Lazaro Arms, MD  megestrol (MEGACE) 40 MG tablet Take 1 tablet (40 mg total) by mouth daily. 02/27/14   Lazaro Arms, MD  metroNIDAZOLE (FLAGYL) 500 MG tablet Take 1 tablet (500 mg total) by mouth 2 (two) times daily. 02/07/14   Lazaro Arms, MD  ondansetron (ZOFRAN) 8 MG tablet Take 1 tablet (8 mg total) by mouth every 8 (eight) hours as needed for nausea. 01/16/14   Lazaro Arms, MD   BP 100/55  Pulse 65  Temp(Src) 98.7 F (37.1 C) (Oral)  Resp 16  Ht 5\' 6"  (1.676 m)  Wt 166 lb (75.297 kg)  BMI 26.81 kg/m2  SpO2 98%  LMP 02/01/2014 Physical Exam  Constitutional: She is oriented to person,  place, and time. She appears well-developed and well-nourished. No distress.  HENT:  Mouth/Throat: Oropharynx is clear and moist.  Pulmonary/Chest: Effort normal.  Abdominal: Soft. She exhibits no mass. There is no rebound and no guarding.  Left abdominal tenderness without guarding or mass. Nondistended abdomen.   Genitourinary:  White, smooth vaginal discharge present. Cervix appears normal and is nontender. Mild right adnexal tenderness without palpable mass.   Musculoskeletal: Normal range of motion.  Neurological: She is alert and oriented to person, place, and time.  Skin: Skin is warm and dry.  Psychiatric: She  has a normal mood and affect.    ED Course  Procedures (including critical care time) Labs Review Labs Reviewed  WET PREP, GENITAL - Abnormal; Notable for the following:    Clue Cells Wet Prep HPF POC FEW (*)    WBC, Wet Prep HPF POC FEW (*)    All other components within normal limits  CBC WITH DIFFERENTIAL - Abnormal; Notable for the following:    MCV 77.4 (*)    MCH 25.9 (*)    All other components within normal limits  GC/CHLAMYDIA PROBE AMP  URINALYSIS, ROUTINE W REFLEX MICROSCOPIC  BASIC METABOLIC PANEL  PREGNANCY, URINE    Imaging Review US Transvaginal Non-ob  03/15/2014   CLINICAL DATA:  Pelvic pain.  EXAM: TRANSABDOMINAL AND TRANSVAGINAL ULTRASOUND OF PELVIS  TECHNIQUE: Both transabdominal and transvaginal ultrasound examinations of the pelvis were performed. Transabdominal technique was performed for global imaging of the pelvis including uterus, ovaries, adnexal regions, and pelvic cul-de-sac. It was necessary to proceed with endovaginal exam following the transabdominal exam to visualize the endometrium and ovaries.  COMPARISON:  Ultrasound of February 09, 2009.  FINDINGS: Uterus  Measurements: 8.4 x 6.2 x 5.1 cm. Retroverted. No fibroids or other mass visualized.  Endometrium  Thickness: 13 mm which is within normal limits for a patient of reproductive age. No focal abnormality visualized.  Right ovary  Measurements: 2.6 x 1.9 x 1.5 cm. Normal appearance/no adnexal mass.  Left ovary  Measurements: 3.9 x 3.4 x 3.4 cm. 3.4 cm simple cyst is noted.  Other findings  Trace free fluid is noted which most likely is physiologic.  IMPRESSION: 3.4 cm left ovarian follicular cyst is noted. No other significant abnormality seen in the pelvis.   Electronically Signed   By: Roque Lias M.D.   On: 03/15/2014 15:27   US Pelvis Complete  03/15/2014   CLINICAL DATA:  Pelvic pain.  EXAM: TRANSABDOMINAL AND TRANSVAGINAL ULTRASOUND OF PELVIS  TECHNIQUE: Both transabdominal and transvaginal ultrasound  examinations of the pelvis were performed. Transabdominal technique was performed for global imaging of the pelvis including uterus, ovaries, adnexal regions, and pelvic cul-de-sac. It was necessary to proceed with endovaginal exam following the transabdominal exam to visualize the endometrium and ovaries.  COMPARISON:  Ultrasound of February 09, 2009.  FINDINGS: Uterus  Measurements: 8.4 x 6.2 x 5.1 cm. Retroverted. No fibroids or other mass visualized.  Endometrium  Thickness: 13 mm which is within normal limits for a patient of reproductive age. No focal abnormality visualized.  Right ovary  Measurements: 2.6 x 1.9 x 1.5 cm. Normal appearance/no adnexal mass.  Left ovary  Measurements: 3.9 x 3.4 x 3.4 cm. 3.4 cm simple cyst is noted.  Other findings  Trace free fluid is noted which most likely is physiologic.  IMPRESSION: 3.4 cm left ovarian follicular cyst is noted. No other significant abnormality seen in the pelvis.   Electronically Signed   By: Fayrene Fearing  Green M.D.   On: 03/15/2014 15:27     EKG Interpretation None      MDM   Final diagnoses:  None    1. Left ovarian cyst  She is feeling better with medications. VSS, afebrile. Normal US excepting left ovarian cyst. Stable for discharge to follow up with GYN outpatient.     Arnoldo HookerShari A Ajanae Virag, PA-C 03/15/14 1541

## 2014-03-16 LAB — GC/CHLAMYDIA PROBE AMP
CT Probe RNA: NEGATIVE
GC Probe RNA: NEGATIVE

## 2014-03-16 NOTE — ED Provider Notes (Signed)
Medical screening examination/treatment/procedure(s) were conducted as a shared visit with non-physician practitioner(s) or resident  and myself.  I personally evaluated the patient during the encounter and agree with the findings.   I have personally reviewed any xrays and/ or EKG's with the provider and I agree with interpretation.   Patient with intermittent pelvic pain and bleeding since ablation in May. Patient does have close followup with OB/GYN. Patient denies current bleeding however does have lower pelvic pain worse in the left. On exam patient is mild left pelvic and suprapubic tenderness with palpation, no guarding or rigidity, soft abdomen, well-appearing otherwise. Plan for urinalysis pelvic per physician assistant and ultrasound.  L ovarian cyst  Monica SkeensJoshua M Braylynn Lewing, MD 03/16/14 [phone]

## 2014-03-29 ENCOUNTER — Ambulatory Visit: Payer: Self-pay | Admitting: Obstetrics & Gynecology

## 2014-07-28 ENCOUNTER — Encounter (HOSPITAL_BASED_OUTPATIENT_CLINIC_OR_DEPARTMENT_OTHER): Payer: Self-pay | Admitting: *Deleted

## 2014-07-28 ENCOUNTER — Emergency Department (HOSPITAL_BASED_OUTPATIENT_CLINIC_OR_DEPARTMENT_OTHER)
Admission: EM | Admit: 2014-07-28 | Discharge: 2014-07-28 | Disposition: A | Discharge status: Home / Self Care | Payer: Self-pay | Attending: Emergency Medicine | Admitting: Emergency Medicine

## 2014-07-28 ENCOUNTER — Emergency Department (HOSPITAL_BASED_OUTPATIENT_CLINIC_OR_DEPARTMENT_OTHER): Payer: No Typology Code available for payment source

## 2014-07-28 DIAGNOSIS — Z792 Long term (current) use of antibiotics: Secondary | ICD-10-CM | POA: Insufficient documentation

## 2014-07-28 DIAGNOSIS — F17200 Nicotine dependence, unspecified, uncomplicated: Secondary | ICD-10-CM

## 2014-07-28 DIAGNOSIS — R0982 Postnasal drip: Secondary | ICD-10-CM

## 2014-07-28 DIAGNOSIS — Z720 Tobacco use: Secondary | ICD-10-CM | POA: Insufficient documentation

## 2014-07-28 DIAGNOSIS — Z79899 Other long term (current) drug therapy: Secondary | ICD-10-CM | POA: Insufficient documentation

## 2014-07-28 DIAGNOSIS — Z791 Long term (current) use of non-steroidal anti-inflammatories (NSAID): Secondary | ICD-10-CM | POA: Insufficient documentation

## 2014-07-28 DIAGNOSIS — R059 Cough, unspecified: Secondary | ICD-10-CM

## 2014-07-28 DIAGNOSIS — J069 Acute upper respiratory infection, unspecified: Secondary | ICD-10-CM

## 2014-07-28 DIAGNOSIS — M546 Pain in thoracic spine: Secondary | ICD-10-CM | POA: Insufficient documentation

## 2014-07-28 DIAGNOSIS — R05 Cough: Secondary | ICD-10-CM

## 2014-07-28 MED ORDER — HYDROCODONE-ACETAMINOPHEN 5-325 MG PO TABS: ORAL_TABLET | ORAL | Status: DC

## 2014-07-28 MED ORDER — FLUTICASONE PROPIONATE 50 MCG/ACT NA SUSP
2.0000 | Freq: Every day | NASAL | Status: DC
  Filled 2014-07-28: qty 16

## 2014-07-28 NOTE — ED Notes (Addendum)
Patient states she has a cough, non productive since yesterday, sinus pressure and congestion

## 2014-07-28 NOTE — ED Notes (Signed)
Flonase is not in formulary. NP aware. Instructed pt to buy otc.

## 2014-07-28 NOTE — ED Provider Notes (Signed)
CSN: [id number]     Arrival date & time 07/28/14  1400 History   First MD Initiated Contact with Patient 07/28/14 1619     Chief Complaint  Patient presents with  . Cough     (Consider location/radiation/quality/duration/timing/severity/associated sxs/prior Treatment) HPI  Louanna RawDana B Snider is a 38 y.o. female with past medical history significant for tobacco use disorder complaining of nonproductive cough, sinus congestion onset yesterday associated with pleuritic abdominal and upper back pain. She denies fever, chills, cough, shortness of breath, nausea, vomiting, change in bowel or bladder habits.  Past Medical History  Diagnosis Date  . Back pain    Past Surgical History  Procedure Laterality Date  . Cesarean section    . Tubal ligation    . Dilitation & currettage/hystroscopy with thermachoice ablation N/A 01/16/2014    Procedure: DILATATION & CURETTAGE/HYSTEROSCOPY WITH THERMACHOICE ABLATION;  Surgeon: Lazaro ArmsLuther H Eure, MD;  Location: [facility];  Service: Gynecology;  Laterality: N/A;  total time 11 minutes and 2 seconds; total D5 in 57ml, total out 57ml; 86-88 degrees celcius   Family History  Problem Relation Age of Onset  . Heart disease Mother   . Heart disease Father    History  Substance Use Topics  . Smoking status: Current Every Day Smoker -- 0.75 packs/day for 20 years    Types: Cigarettes  . Smokeless tobacco: Never Used  . Alcohol Use: No   OB History    No data available     Review of Systems  10 systems reviewed and found to be negative, except as noted in the HPI.   Allergies  Clindamycin/lincomycin and Sulfa antibiotics  Home Medications   Prior to Admission medications   Medication Sig Start Date End Date Taking? Authorizing Provider  doxycycline (VIBRAMYCIN) 100 MG capsule Take 1 capsule (100 mg total) by mouth 2 (two) times daily. 02/27/14   Lazaro ArmsLuther H Eure, MD  HYDROcodone-acetaminophen (NORCO/VICODIN) 5-325 MG per tablet Take 1-2 tablets by mouth  every 4 (four) hours as needed for moderate pain. 03/15/14   Shari A Upstill, PA-C  HYDROcodone-acetaminophen (NORCO/VICODIN) 5-325 MG per tablet Take 1-2 tablets by mouth every 6 hours as needed for pain. 07/28/14   Jaycelyn Orrison, PA-C  ibuprofen (ADVIL,MOTRIN) 600 MG tablet Take 1 tablet (600 mg total) by mouth every 6 (six) hours as needed. 09/09/13   Hope Orlene OchM Neese, NP  ibuprofen (ADVIL,MOTRIN) 800 MG tablet Take 1 tablet (800 mg total) by mouth 3 (three) times daily. 03/15/14   Shari A Upstill, PA-C  ketorolac (TORADOL) 10 MG tablet Take 1 tablet (10 mg total) by mouth every 8 (eight) hours as needed. 01/16/14   Lazaro ArmsLuther H Eure, MD  megestrol (MEGACE) 40 MG tablet Take 1 tablet (40 mg total) by mouth daily. 02/27/14   Lazaro ArmsLuther H Eure, MD  metroNIDAZOLE (FLAGYL) 500 MG tablet Take 1 tablet (500 mg total) by mouth 2 (two) times daily. 02/07/14   Lazaro ArmsLuther H Eure, MD  ondansetron (ZOFRAN) 8 MG tablet Take 1 tablet (8 mg total) by mouth every 8 (eight) hours as needed for nausea. 01/16/14   Lazaro ArmsLuther H Eure, MD   BP 112/72 mmHg  Pulse 91  Temp(Src) 99.2 F (37.3 C) (Oral)  Resp 18  Ht 5\' 6"  (1.676 m)  Wt 160 lb (72.576 kg)  BMI 25.84 kg/m2  SpO2 96%  LMP 07/25/2014 Physical Exam  Constitutional: She is oriented to person, place, and time. She appears well-developed and well-nourished. No distress.  HENT:  Head: Normocephalic  and atraumatic.  Mouth/Throat: Oropharynx is clear and moist.  Rhinorrhea and nasal mucosa edema, no tenderness to palpation of frontal or maxillary sinuses  Posterior pharynx is injected  Eyes: Conjunctivae and EOM are normal. Pupils are equal, round, and reactive to light.  Neck: Normal range of motion.  Cardiovascular: Normal rate, regular rhythm and intact distal pulses.   Pulmonary/Chest: Effort normal and breath sounds normal. No stridor. No respiratory distress. She has no wheezes. She has no rales. She exhibits no tenderness.  Abdominal: Soft. She exhibits no distension  and no mass. There is no tenderness. There is no rebound and no guarding.  Musculoskeletal: Normal range of motion.  Neurological: She is alert and oriented to person, place, and time.  Psychiatric: She has a normal mood and affect.  Nursing note and vitals reviewed.   ED Course  Procedures (including critical care time) Labs Review Labs Reviewed - No data to display  Imaging Review Dg Chest 2 View  07/28/2014   CLINICAL DATA:  Cough.  EXAM: CHEST  2 VIEW  COMPARISON:  None.  FINDINGS: The heart size and mediastinal contours are within normal limits. No pneumothorax or pleural effusion is noted. Emphysematous changes seen in the upper lobes bilaterally. No acute pulmonary disease is noted. The visualized skeletal structures are unremarkable.  IMPRESSION: Emphysematous disease is noted in the upper lobes bilaterally. No acute cardiopulmonary abnormality seen.   Electronically Signed   By: Roque LiasJames  Green M.D.   On: 07/28/2014 14:49     EKG Interpretation None      MDM   Final diagnoses:  URI (upper respiratory infection)  Post-nasal drip  Tobacco use disorder    Filed Vitals:   07/28/14 1415 07/28/14 1556  BP: 121/76 112/72  Pulse: 118 91  Temp: 99.2 F (37.3 C)   TempSrc: Oral   Resp: 18 18  Height: 5\' 6"  (1.676 m)   Weight: 160 lb (72.576 kg)   SpO2: 99% 96%    Medications  fluticasone (FLONASE) 50 MCG/ACT nasal spray 2 spray (not administered)    Louanna RawDana B Snider is a 38 y.o. female presenting with cough, pleuritic chest pain, rhinorrhea and sinus congestion. Patient is saturating well on room air. Chest x-ray with no infiltrate, lung sounds are clear to auscultation. Chest x-ray does show early emphysematous disease. Patient is a smoker. I've advised her to DC smoking. I think that her cough is likely from postnasal drip. Flonase and Norco for the discomfort that is caused by musculoskeletal irritation from coughing.  Evaluation does not show pathology that would  require ongoing emergent intervention or inpatient treatment. Pt is hemodynamically stable and mentating appropriately. Discussed findings and plan with patient/guardian, who agrees with care plan. All questions answered. Return precautions discussed and outpatient follow up given.      Wynetta Emeryicole Danthony Kendrix, PA-C 07/28/14 1740  Tilden FossaElizabeth Rees, MD 07/29/14 [phone]

## 2014-07-28 NOTE — Discharge Instructions (Signed)
Use nasal saline (you can try [employer] Simply Saline) at least 4 times a day, use saline 5-10 minutes before using the fluticasone (flonase)  Do not use Afrin (Oxymetazoline)  Rest, wash hands frequently  and drink plenty of water.  Take vicodin for breakthrough pain, do not drink alcohol, drive, care for children or do other critical tasks while taking vicodin.  Please follow with your primary care doctor in the next 2 days for a check-up. They must obtain records for further management.   Do not hesitate to return to the Emergency Department for any new, worsening or concerning symptoms.    Cough, Adult  A cough is a reflex that helps clear your throat and airways. It can help heal the body or may be a reaction to an irritated airway. A cough may only last 2 or 3 weeks (acute) or may last more than 8 weeks (chronic).  CAUSES Acute cough:  Viral or bacterial infections. Chronic cough:  Infections.  Allergies.  Asthma.  Post-nasal drip.  Smoking.  Heartburn or acid reflux.  Some medicines.  Chronic lung problems (COPD).  Cancer. SYMPTOMS   Cough.  Fever.  Chest pain.  Increased breathing rate.  High-pitched whistling sound when breathing (wheezing).  Colored mucus that you cough up (sputum). TREATMENT   A bacterial cough may be treated with antibiotic medicine.  A viral cough must run its course and will not respond to antibiotics.  Your caregiver may recommend other treatments if you have a chronic cough. HOME CARE INSTRUCTIONS   Only take over-the-counter or prescription medicines for pain, discomfort, or fever as directed by your caregiver. Use cough suppressants only as directed by your caregiver.  Use a cold steam vaporizer or humidifier in your bedroom or home to help loosen secretions.  Sleep in a semi-upright position if your cough is worse at night.  Rest as needed.  Stop smoking if you smoke. SEEK IMMEDIATE MEDICAL CARE IF:   You  have pus in your sputum.  Your cough starts to worsen.  You cannot control your cough with suppressants and are losing sleep.  You begin coughing up blood.  You have difficulty breathing.  You develop pain which is getting worse or is uncontrolled with medicine.  You have a fever. MAKE SURE YOU:   Understand these instructions.  Will watch your condition.  Will get help right away if you are not doing well or get worse. Document Released: 02/12/2011 Document Revised: 11/08/2011 Document Reviewed: 02/12/2011 [facility] Patient Information 2015 [employer] [place]. This information is not intended to replace advice given to you by your health care provider. Make sure you discuss any questions you have with your health care provider.

## 2020-02-08 ENCOUNTER — Other Ambulatory Visit: Payer: Self-pay

## 2020-02-08 ENCOUNTER — Emergency Department (HOSPITAL_BASED_OUTPATIENT_CLINIC_OR_DEPARTMENT_OTHER)
Admission: EM | Admit: 2020-02-08 | Discharge: 2020-02-08 | Disposition: A | Discharge status: Home / Self Care | Payer: Self-pay | Attending: Emergency Medicine | Admitting: Emergency Medicine

## 2020-02-08 ENCOUNTER — Encounter (HOSPITAL_BASED_OUTPATIENT_CLINIC_OR_DEPARTMENT_OTHER): Payer: Self-pay | Admitting: Emergency Medicine

## 2020-02-08 DIAGNOSIS — L02214 Cutaneous abscess of groin: Secondary | ICD-10-CM | POA: Insufficient documentation

## 2020-02-08 DIAGNOSIS — B9689 Other specified bacterial agents as the cause of diseases classified elsewhere: Secondary | ICD-10-CM | POA: Insufficient documentation

## 2020-02-08 DIAGNOSIS — L0291 Cutaneous abscess, unspecified: Secondary | ICD-10-CM

## 2020-02-08 DIAGNOSIS — F17210 Nicotine dependence, cigarettes, uncomplicated: Secondary | ICD-10-CM | POA: Insufficient documentation

## 2020-02-08 DIAGNOSIS — Z881 Allergy status to other antibiotic agents status: Secondary | ICD-10-CM | POA: Insufficient documentation

## 2020-02-08 DIAGNOSIS — J449 Chronic obstructive pulmonary disease, unspecified: Secondary | ICD-10-CM | POA: Insufficient documentation

## 2020-02-08 HISTORY — DX: Chronic obstructive pulmonary disease, unspecified: J44.9

## 2020-02-08 MED ORDER — LIDOCAINE HCL (PF) 1 % IJ SOLN
5.0000 mL | Freq: Once | INTRAMUSCULAR | Status: AC
  Administered 2020-02-08: 5 mL via INTRADERMAL
  Filled 2020-02-08: qty 5

## 2020-02-08 MED ORDER — ONDANSETRON 4 MG PO TBDP: 4.0000 mg | ORAL_TABLET | Freq: Four times a day (QID) | ORAL | 0 refills | Status: AC | PRN

## 2020-02-08 MED ORDER — HYDROCODONE-ACETAMINOPHEN 5-325 MG PO TABS
2.0000 | ORAL_TABLET | Freq: Once | ORAL | Status: AC
  Administered 2020-02-08: 2 via ORAL
  Filled 2020-02-08: qty 2

## 2020-02-08 MED ORDER — LIDOCAINE 4 % EX CREA
TOPICAL_CREAM | Freq: Once | CUTANEOUS | Status: AC
  Administered 2020-02-08: 1 via TOPICAL
  Filled 2020-02-08: qty 5

## 2020-02-08 MED ORDER — CEPHALEXIN 500 MG PO CAPS: 500.0000 mg | ORAL_CAPSULE | Freq: Four times a day (QID) | ORAL | 0 refills | Status: DC

## 2020-02-08 MED ORDER — ONDANSETRON 4 MG PO TBDP
4.0000 mg | ORAL_TABLET | Freq: Once | ORAL | Status: AC
  Administered 2020-02-08: 4 mg via ORAL
  Filled 2020-02-08: qty 1

## 2020-02-08 MED ORDER — HYDROCODONE-ACETAMINOPHEN 5-325 MG PO TABS: 2.0000 | ORAL_TABLET | Freq: Four times a day (QID) | ORAL | 0 refills | Status: DC | PRN

## 2020-02-08 MED ORDER — PROBIOTIC PO CAPS: 1.0000 | ORAL_CAPSULE | Freq: Every day | ORAL | 0 refills | Status: AC

## 2020-02-08 MED ORDER — CEPHALEXIN 250 MG PO CAPS
500.0000 mg | ORAL_CAPSULE | Freq: Once | ORAL | Status: AC
  Administered 2020-02-08: 500 mg via ORAL
  Filled 2020-02-08: qty 2

## 2020-02-08 NOTE — ED Triage Notes (Signed)
Patient presents with complaints of abscess to left groin area; states onset last Wednesday; States seen last Friday and given antibiotics; states pain and swelling worsened; area opened up and drainage noted upon arrival to room.

## 2020-02-08 NOTE — ED Provider Notes (Signed)
TIME SEEN: 2:10 AM  CHIEF COMPLAINT: Abscess  HPI: Patient is a 44 year old female with history of COPD, previous abscesses who presents to emergency department with an abscess that has been present in the left inguinal fold for the past 9 days.  Was seen at [facility] a week ago and started on doxycycline but did not have an incision and drainage at that time.  States wound has gotten much larger, more painful.  She did note a small amount of drainage now that she is arrived in the emergency department.  She reports she has taken a week of doxycycline and has 4 days left.  No fevers, vomiting.  No history of diabetes.  Has had have previous incision and drainage performed.  ROS: See HPI Constitutional: no fever  Eyes: no drainage  ENT: no runny nose   Cardiovascular:  no chest pain  Resp: no SOB  GI: no vomiting GU: no dysuria Integumentary: no rash  Allergy: no hives  Musculoskeletal: no leg swelling  Neurological: no slurred speech ROS otherwise negative  PAST MEDICAL HISTORY/PAST SURGICAL HISTORY:  Past Medical History:  Diagnosis Date  . Back pain   . COPD (chronic obstructive pulmonary disease) (HCC)     MEDICATIONS:  Prior to Admission medications   Medication Sig Start Date End Date Taking? Authorizing Provider  albuterol (VENTOLIN HFA) 108 (90 Base) MCG/ACT inhaler Inhale into the lungs. 11/27/18  Yes [provider]  doxycycline (VIBRAMYCIN) 100 MG capsule Take by mouth. 02/02/20 02/12/20 Yes [provider]  doxycycline (VIBRAMYCIN) 100 MG capsule Take 1 capsule (100 mg total) by mouth 2 (two) times daily. 02/27/14   Lazaro Arms, MD  HYDROcodone-acetaminophen (NORCO/VICODIN) 5-325 MG per tablet Take 1-2 tablets by mouth every 4 (four) hours as needed for moderate pain. 03/15/14   Elpidio Anis, PA-C  HYDROcodone-acetaminophen (NORCO/VICODIN) 5-325 MG per tablet Take 1-2 tablets by mouth every 6 hours as needed for pain. 07/28/14   Pisciotta, Joni Reining, PA-C   ibuprofen (ADVIL,MOTRIN) 600 MG tablet Take 1 tablet (600 mg total) by mouth every 6 (six) hours as needed. 09/09/13   Janne Napoleon, NP  ibuprofen (ADVIL,MOTRIN) 800 MG tablet Take 1 tablet (800 mg total) by mouth 3 (three) times daily. 03/15/14   Elpidio Anis, PA-C  ketorolac (TORADOL) 10 MG tablet Take 1 tablet (10 mg total) by mouth every 8 (eight) hours as needed. 01/16/14   Lazaro Arms, MD  megestrol (MEGACE) 40 MG tablet Take 1 tablet (40 mg total) by mouth daily. 02/27/14   Lazaro Arms, MD  metroNIDAZOLE (FLAGYL) 500 MG tablet Take 1 tablet (500 mg total) by mouth 2 (two) times daily. 02/07/14   Lazaro Arms, MD  ondansetron (ZOFRAN) 8 MG tablet Take 1 tablet (8 mg total) by mouth every 8 (eight) hours as needed for nausea. 01/16/14   Lazaro Arms, MD    ALLERGIES:  Allergies  Allergen Reactions  . Clindamycin/Lincomycin Anaphylaxis    blisters  . Sulfa Antibiotics Other (See Comments)    blisters    SOCIAL HISTORY:  Social History   Tobacco Use  . Smoking status: Current Every Day Smoker    Packs/day: 0.75    Years: 20.00    Pack years: 15.00    Types: Cigarettes  . Smokeless tobacco: Never Used  Substance Use Topics  . Alcohol use: No    FAMILY HISTORY: Family History  Problem Relation Age of Onset  . Heart disease Mother   . Heart disease  Father     EXAM: BP (!) 122/98 (BP Location: Right Arm)   Pulse 85   Temp 98.4 F (36.9 C) (Oral)   Resp 18   Wt 72 kg   LMP 01/25/2020   SpO2 99%   BMI 25.62 kg/m  CONSTITUTIONAL: Alert and oriented and responds appropriately to questions. Well-appearing; well-nourished, nontoxic-appearing, afebrile but does appear uncomfortable HEAD: Normocephalic EYES: Conjunctivae clear, pupils appear equal, EOM appear intact ENT: normal nose; moist mucous membranes NECK: Supple, normal ROM CARD: RRR; S1 and S2 appreciated; no murmurs, no clicks, no rubs, no gallops RESP: Normal chest excursion without splinting or  tachypnea; breath sounds clear and equal bilaterally; no wheezes, no rhonchi, no rales, no hypoxia or respiratory distress, speaking full sentences ABD/GI: Normal bowel sounds; non-distended; soft, non-tender, no rebound, no guarding, no peritoneal signs, no hepatosplenomegaly GU: 5 x 4 cm area of fluctuance to the left inguinal fold with 2 punctate open wounds draining very small amounts of purulent fluid.  There is approximately 2 to 3 cm of surrounding redness, warmth and induration.  She does have some appreciable inguinal lymphadenopathy.  No crepitus, subcutaneous gas.  Does not appear to extend to the perineum. BACK:  The back appears normal EXT: Normal ROM in all joints; no deformity noted, no edema; no cyanosis SKIN: Normal color for age and race; warm; no rash on exposed skin NEURO: Moves all extremities equally PSYCH: The patient's mood and manner are appropriate.   MEDICAL DECISION MAKING: Patient here with abscess and surrounding cellulitis.  On doxycycline without relief.  She will require incision and drainage here.  No systemic symptoms.  Not immunocompromised or diabetic.  Will provide with pain medication, LMX prior to incision and drainage for pain control.  She reports her fianc drove her here to the emergency department.  Will send wound culture.  She states she has grown staph before but is not sure if it was MRSA.  Will broaden antibiotics with Keflex but will have patient finish her course of doxycycline.  ED PROGRESS: Patient tolerated incision and drainage well.  Wound culture has been sent.  Packing in place.  She has a PCP for follow-up.  She will remove packing in 3 days.  Recommend warm compresses and warm sits baths.  Will discharge with brief prescription of pain medication for further discomfort.  Will provide with work note.  At this time, I do not feel there is any life-threatening condition present. I have reviewed, interpreted and discussed all results (EKG,  imaging, lab, urine as appropriate) and exam findings with patient/family. I have reviewed nursing notes and appropriate previous records.  I feel the patient is safe to be discharged home without further emergent workup and can continue workup as an outpatient as needed. Discussed usual and customary return precautions. Patient/family verbalize understanding and are comfortable with this plan.  Outpatient follow-up has been provided as needed. All questions have been answered.    INCISION AND DRAINAGE Performed by: Cyril Mourning Marianny Goris Consent: Verbal consent obtained. Risks and benefits: risks, benefits and alternatives were discussed Type: abscess  Body area: Left inguinal area  Anesthesia: local infiltration  Incision was made with a scalpel.  Local anesthetic: lidocaine 1% without epinephrine  Anesthetic total: 5 ml  Complexity: complex Blunt dissection to break up loculations  Drainage: purulent  Drainage amount: Moderate  Packing material: 1/4 in iodoform gauze  Patient tolerance: Patient tolerated the procedure well with no immediate complications.    Monica Arnold was evaluated  in Emergency Department on 02/08/2020 for the symptoms described in the history of present illness. She was evaluated in the context of the global COVID-19 pandemic, which necessitated consideration that the patient might be at risk for infection with the SARS-CoV-2 virus that causes COVID-19. Institutional protocols and algorithms that pertain to the evaluation of patients at risk for COVID-19 are in a state of rapid change based on information released by regulatory bodies including the CDC and federal and state organizations. These policies and algorithms were followed during the patient's care in the ED.      Saki Legore, Layla Maw, DO 02/08/20 [phone]

## 2020-02-08 NOTE — Discharge Instructions (Addendum)
You may alternate Tylenol 1000 mg every 6 hours as needed for pain, fever and Ibuprofen 800 mg every 8 hours as needed for pain, fever.  Please take Ibuprofen with food.  Do not take more than 4000 mg of Tylenol (acetaminophen) in a 24 hour period.  You may remove your packing in 72 hours.  If it comes out before then, that is fine and you do not have to return to the emergency department for repacking.  You may also have this done by your primary care physician.  Please take your antibiotics (doxycycline and cephalexin) until complete.  I recommend warm sitz baths and/or warm compresses to this area multiple times a day to help with drainage.

## 2020-02-10 LAB — AEROBIC CULTURE W GRAM STAIN (SUPERFICIAL SPECIMEN)
Culture: NO GROWTH
Gram Stain: NONE SEEN

## 2020-03-31 ENCOUNTER — Emergency Department (HOSPITAL_BASED_OUTPATIENT_CLINIC_OR_DEPARTMENT_OTHER)
Admission: EM | Admit: 2020-03-31 | Discharge: 2020-03-31 | Disposition: A | Discharge status: Home / Self Care | Payer: Self-pay | Attending: Emergency Medicine | Admitting: Emergency Medicine

## 2020-03-31 ENCOUNTER — Other Ambulatory Visit: Payer: Self-pay

## 2020-03-31 ENCOUNTER — Encounter (HOSPITAL_BASED_OUTPATIENT_CLINIC_OR_DEPARTMENT_OTHER): Payer: Self-pay | Admitting: Emergency Medicine

## 2020-03-31 ENCOUNTER — Emergency Department (HOSPITAL_BASED_OUTPATIENT_CLINIC_OR_DEPARTMENT_OTHER): Payer: Self-pay

## 2020-03-31 DIAGNOSIS — Z5321 Procedure and treatment not carried out due to patient leaving prior to being seen by health care provider: Secondary | ICD-10-CM | POA: Insufficient documentation

## 2020-03-31 DIAGNOSIS — R05 Cough: Secondary | ICD-10-CM | POA: Insufficient documentation

## 2020-03-31 DIAGNOSIS — M791 Myalgia, unspecified site: Secondary | ICD-10-CM | POA: Insufficient documentation

## 2020-03-31 DIAGNOSIS — R079 Chest pain, unspecified: Secondary | ICD-10-CM | POA: Insufficient documentation

## 2020-03-31 LAB — CBC
HCT: 42.7 % (ref 36.0–46.0)
Hemoglobin: 14.8 g/dL (ref 12.0–15.0)
MCH: 29.8 pg (ref 26.0–34.0)
MCHC: 34.7 g/dL (ref 30.0–36.0)
MCV: 86.1 fL (ref 80.0–100.0)
Platelets: 244 10*3/uL (ref 150–400)
RBC: 4.96 MIL/uL (ref 3.87–5.11)
RDW: 11.8 % (ref 11.5–15.5)
WBC: 11.6 10*3/uL — ABNORMAL HIGH (ref 4.0–10.5)
nRBC: 0 % (ref 0.0–0.2)

## 2020-03-31 LAB — BASIC METABOLIC PANEL
Anion gap: 12 (ref 5–15)
BUN: 13 mg/dL (ref 6–20)
CO2: 24 mmol/L (ref 22–32)
Calcium: 8.8 mg/dL — ABNORMAL LOW (ref 8.9–10.3)
Chloride: 102 mmol/L (ref 98–111)
Creatinine, Ser: 0.74 mg/dL (ref 0.44–1.00)
GFR calc Af Amer: >60 mL/min (ref >60)
GFR calc non Af Amer: >60 mL/min (ref >60)
Glucose, Bld: 124 mg/dL — ABNORMAL HIGH (ref 70–99)
Potassium: 3.7 mmol/L (ref 3.5–5.1)
Sodium: 138 mmol/L (ref 135–145)

## 2020-03-31 LAB — PREGNANCY, URINE: Preg Test, Ur: NEGATIVE

## 2020-03-31 LAB — TROPONIN I (HIGH SENSITIVITY): Troponin I (High Sensitivity): 2 ng/L (ref <18)

## 2020-03-31 NOTE — ED Triage Notes (Signed)
Chest pain since 1730. Cough for 2 days, and bodyaches.

## 2020-03-31 NOTE — ED Notes (Signed)
PT refused COVID swab in triage

## 2021-05-11 ENCOUNTER — Emergency Department (HOSPITAL_BASED_OUTPATIENT_CLINIC_OR_DEPARTMENT_OTHER)
Admission: EM | Admit: 2021-05-11 | Discharge: 2021-05-11 | Disposition: A | Discharge status: Home / Self Care | Payer: Self-pay | Attending: Emergency Medicine | Admitting: Emergency Medicine

## 2021-05-11 ENCOUNTER — Other Ambulatory Visit: Payer: Self-pay

## 2021-05-11 ENCOUNTER — Encounter (HOSPITAL_BASED_OUTPATIENT_CLINIC_OR_DEPARTMENT_OTHER): Payer: Self-pay | Admitting: *Deleted

## 2021-05-11 ENCOUNTER — Emergency Department (HOSPITAL_BASED_OUTPATIENT_CLINIC_OR_DEPARTMENT_OTHER): Payer: Self-pay

## 2021-05-11 DIAGNOSIS — Z91040 Latex allergy status: Secondary | ICD-10-CM | POA: Insufficient documentation

## 2021-05-11 DIAGNOSIS — Z7951 Long term (current) use of inhaled steroids: Secondary | ICD-10-CM | POA: Insufficient documentation

## 2021-05-11 DIAGNOSIS — J441 Chronic obstructive pulmonary disease with (acute) exacerbation: Secondary | ICD-10-CM | POA: Insufficient documentation

## 2021-05-11 DIAGNOSIS — F17210 Nicotine dependence, cigarettes, uncomplicated: Secondary | ICD-10-CM | POA: Insufficient documentation

## 2021-05-11 MED ORDER — ALBUTEROL SULFATE HFA 108 (90 BASE) MCG/ACT IN AERS
2.0000 | INHALATION_SPRAY | Freq: Once | RESPIRATORY_TRACT | Status: AC
  Administered 2021-05-11: 2 via RESPIRATORY_TRACT
  Filled 2021-05-11: qty 6.7

## 2021-05-11 MED ORDER — DOXYCYCLINE HYCLATE 100 MG PO CAPS: 100.0000 mg | ORAL_CAPSULE | Freq: Two times a day (BID) | ORAL | 0 refills | Status: AC

## 2021-05-11 MED ORDER — PREDNISONE 10 MG PO TABS: 20.0000 mg | ORAL_TABLET | Freq: Every day | ORAL | 0 refills | Status: AC

## 2021-05-11 MED ORDER — PREDNISONE 20 MG PO TABS
20.0000 mg | ORAL_TABLET | Freq: Once | ORAL | Status: AC
  Administered 2021-05-11: 20 mg via ORAL
  Filled 2021-05-11: qty 1

## 2021-05-11 NOTE — ED Triage Notes (Signed)
C/o cough x 3 days , chest wall pain and back pain with cough

## 2021-05-11 NOTE — ED Provider Notes (Signed)
[facility] EMERGENCY DEPARTMENT Provider Note   CSN: [id number] Arrival date & time: 05/11/21  1608     History Chief Complaint  Patient presents with   Cough    Monica Arnold is a 45 y.o. female.  Patient is a 45 yo female presenting for cough. Pt admits to cough x 3 days with associated wheezing. States "this feels like a copd flare". Denies any chest pain, fever, chills, nausea, vomiting, or diarrhea. States she was recommended for follow up with pulmonology for copd but does not currently have insurance. States due to this she doesn't have any of her recommended home inhalers. Current smoker.   The history is provided by the patient. No language interpreter was used.  Cough Associated symptoms: shortness of breath and wheezing   Associated symptoms: no chest pain, no chills, no ear pain, no fever, no rash and no sore throat       Past Medical History:  Diagnosis Date   Back pain    COPD (chronic obstructive pulmonary disease) (HCC)     Patient Active Problem List   Diagnosis Date Noted   Excessive or frequent menstruation 01/08/2014    Past Surgical History:  Procedure Laterality Date   CESAREAN SECTION     DILITATION & CURRETTAGE/HYSTROSCOPY WITH THERMACHOICE ABLATION N/A 01/16/2014   Procedure: DILATATION & CURETTAGE/HYSTEROSCOPY WITH THERMACHOICE ABLATION;  Surgeon: Lazaro Arms, MD;  Location: [facility];  Service: Gynecology;  Laterality: N/A;  total time 11 minutes and 2 seconds; total D5 in 24ml, total out 9ml; 86-88 degrees celcius   TUBAL LIGATION       OB History   No obstetric history on file.     Family History  Problem Relation Age of Onset   Heart disease Mother    Heart disease Father     Social History   Tobacco Use   Smoking status: Every Day    Packs/day: 0.75    Years: 20.00    Pack years: 15.00    Types: Cigarettes   Smokeless tobacco: Never  Substance Use Topics   Alcohol use: No   Drug use: No    Home  Medications Prior to Admission medications   Medication Sig Start Date End Date Taking? Authorizing Provider  albuterol (VENTOLIN HFA) 108 (90 Base) MCG/ACT inhaler Inhale into the lungs. 11/27/18   [provider]  cephALEXin (KEFLEX) 500 MG capsule Take 1 capsule (500 mg total) by mouth 4 (four) times daily. 02/08/20   Ward, Layla Maw, DO  HYDROcodone-acetaminophen (NORCO/VICODIN) 5-325 MG tablet Take 2 tablets by mouth every 6 (six) hours as needed. 02/08/20   Ward, Layla Maw, DO  ondansetron (ZOFRAN ODT) 4 MG disintegrating tablet Take 1 tablet (4 mg total) by mouth every 6 (six) hours as needed for nausea or vomiting. 02/08/20   Ward, Layla Maw, DO  Probiotic CAPS Take 1 capsule by mouth daily. Take while on antibiotics 02/08/20   Ward, Layla Maw, DO    Allergies    Clindamycin/lincomycin, Morphine, Latex, and Sulfa antibiotics  Review of Systems   Review of Systems  Constitutional:  Negative for chills and fever.  HENT:  Negative for ear pain and sore throat.   Eyes:  Negative for pain and visual disturbance.  Respiratory:  Positive for cough, shortness of breath and wheezing.   Cardiovascular:  Negative for chest pain and palpitations.  Gastrointestinal:  Negative for abdominal pain and vomiting.  Genitourinary:  Negative for dysuria and hematuria.  Musculoskeletal:  Negative for arthralgias and back pain.  Skin:  Negative for color change and rash.  Neurological:  Negative for seizures and syncope.  All other systems reviewed and are negative.  Physical Exam Updated Vital Signs BP (!) 131/95 (BP Location: Left Arm)   Pulse 87   Temp 98.2 F (36.8 C) (Oral)   Resp 16   Ht 5\' 6"  (1.676 m)   Wt 65.8 kg   LMP 04/15/2021   SpO2 96%   BMI 23.40 kg/m   Physical Exam Vitals and nursing note reviewed.  Constitutional:      General: She is not in acute distress.    Appearance: She is well-developed.  HENT:     Head: Normocephalic and atraumatic.  Eyes:      Conjunctiva/sclera: Conjunctivae normal.  Cardiovascular:     Rate and Rhythm: Normal rate and regular rhythm.     Heart sounds: No murmur heard. Pulmonary:     Effort: Pulmonary effort is normal. No respiratory distress.     Breath sounds: Examination of the right-upper field reveals wheezing. Examination of the left-upper field reveals wheezing. Examination of the right-middle field reveals wheezing. Examination of the left-middle field reveals wheezing. Examination of the right-lower field reveals wheezing. Examination of the left-lower field reveals wheezing. Wheezing present.  Abdominal:     Palpations: Abdomen is soft.     Tenderness: There is no abdominal tenderness.  Musculoskeletal:     Cervical back: Neck supple.  Skin:    General: Skin is warm and dry.  Neurological:     Mental Status: She is alert.    ED Results / Procedures / Treatments   Labs (all labs ordered are listed, but only abnormal results are displayed) Labs Reviewed - No data to display  EKG None  Radiology DG Chest 2 View  Result Date: 05/11/2021 CLINICAL DATA:  Dry cough EXAM: CHEST - 2 VIEW COMPARISON:  07/28/2014 FINDINGS: Cardiac shadow is within normal limits. Emphysematous changes are noted with hyperinflation in the upper lobes bilaterally. This is stable from the prior exam. No focal infiltrate or effusion is seen. No bony abnormality is noted. IMPRESSION: COPD without acute abnormality. Electronically Signed   By: 07/30/2014 M.D.   On: 05/11/2021 16:47    Procedures Procedures   Medications Ordered in ED Medications  albuterol (VENTOLIN HFA) 108 (90 Base) MCG/ACT inhaler 2 puff (2 puffs Inhalation Given 05/11/21 2104)  predniSONE (DELTASONE) tablet 20 mg (20 mg Oral Given 05/11/21 2102)    ED Course  I have reviewed the triage vital signs and the nursing notes.  Pertinent labs & imaging results that were available during my care of the patient were reviewed by me and considered in my  medical decision making (see chart for details).    MDM Rules/Calculators/A&P                           10:37 PM  45 yo female presenting for cough, wheezing, and sob. Pt is Aox3, no acute distress, afebrile, with stable vitals. Physical exam demonstrates diminished breath sounds with wheezing throughout. Pt given albuterol inhaler in ED and prednisone.   Symptoms likely secondary to COPD exacerbation. Doxycyline, prednisone, and inhaler sent to pharmacy.   I spent at least 3 minutes discussing the dangers of smoking and encouraged them to quit.   Patient in no distress and overall condition improved here in the ED. Detailed discussions were had with the patient regarding current  findings, and need for close f/u with PCP or on call doctor. The patient has been instructed to return immediately if the symptoms worsen in any way for re-evaluation. Patient verbalized understanding and is in agreement with current care plan. All questions answered prior to discharge.          Final Clinical Impression(s) / ED Diagnoses Final diagnoses:  COPD exacerbation [facility])    Rx / DC Orders ED Discharge Orders     None        Franne Forts, DO 05/11/21 2240

## 2021-05-11 NOTE — ED Notes (Signed)
Pt. Not in room for discharge.

## 2021-05-11 NOTE — Progress Notes (Signed)
Patient assessed in waiting room. BBS clear and diminished in bases. SAT 98%, but HR increased. Triage nurse informed

## 2021-05-11 NOTE — ED Notes (Signed)
Called pt's cell phone. No answer and VM not set up. Called pts home phone, left VM message re: discharge instructions and prescriptions.

## 2022-03-18 ENCOUNTER — Emergency Department (HOSPITAL_BASED_OUTPATIENT_CLINIC_OR_DEPARTMENT_OTHER): Payer: Self-pay

## 2022-03-18 ENCOUNTER — Encounter (HOSPITAL_BASED_OUTPATIENT_CLINIC_OR_DEPARTMENT_OTHER): Payer: Self-pay | Admitting: Emergency Medicine

## 2022-03-18 ENCOUNTER — Other Ambulatory Visit: Payer: Self-pay

## 2022-03-18 ENCOUNTER — Emergency Department (HOSPITAL_BASED_OUTPATIENT_CLINIC_OR_DEPARTMENT_OTHER)
Admission: EM | Admit: 2022-03-18 | Discharge: 2022-03-18 | Disposition: A | Discharge status: Home / Self Care | Payer: Self-pay | Attending: Emergency Medicine | Admitting: Emergency Medicine

## 2022-03-18 DIAGNOSIS — Z91040 Latex allergy status: Secondary | ICD-10-CM | POA: Insufficient documentation

## 2022-03-18 DIAGNOSIS — M5136 Other intervertebral disc degeneration, lumbar region: Secondary | ICD-10-CM | POA: Insufficient documentation

## 2022-03-18 MED ORDER — DICLOFENAC SODIUM 50 MG PO TBEC: 50.0000 mg | DELAYED_RELEASE_TABLET | Freq: Two times a day (BID) | ORAL | 0 refills | Status: DC

## 2022-03-18 MED ORDER — HYDROCODONE-ACETAMINOPHEN 5-325 MG PO TABS
1.0000 | ORAL_TABLET | Freq: Once | ORAL | Status: AC
  Administered 2022-03-18: 1 via ORAL
  Filled 2022-03-18: qty 1

## 2022-03-18 MED ORDER — PREDNISONE 10 MG (21) PO TBPK: ORAL_TABLET | Freq: Every day | ORAL | 0 refills | Status: AC

## 2022-03-18 MED ORDER — KETOROLAC TROMETHAMINE 30 MG/ML IJ SOLN
30.0000 mg | Freq: Once | INTRAMUSCULAR | Status: AC
  Administered 2022-03-18: 30 mg via INTRAMUSCULAR
  Filled 2022-03-18: qty 1

## 2022-03-18 MED ORDER — METHOCARBAMOL 500 MG PO TABS: 500.0000 mg | ORAL_TABLET | Freq: Two times a day (BID) | ORAL | 0 refills | Status: DC

## 2022-03-18 MED ORDER — METHYLPREDNISOLONE SODIUM SUCC 125 MG IJ SOLR
125.0000 mg | Freq: Once | INTRAMUSCULAR | Status: AC
  Administered 2022-03-18: 125 mg via INTRAMUSCULAR
  Filled 2022-03-18: qty 2

## 2022-03-18 NOTE — ED Provider Notes (Signed)
[facility] EMERGENCY DEPARTMENT Provider Note   CSN: [id number] Arrival date & time: 03/18/22  1449     History  Chief Complaint  Patient presents with   Back Pain    Monica Arnold is a 46 y.o. female.  46 year old female presents with complaint of pain in her back.  Patient reports having had back pain for some time, was previously treated with injections and seemed to be doing well however today she coughed and felt a pop in her lower back with pain shooting down both of her legs.  Denies saddle paresthesias, loss of control of bowel or bladder.  Denies falls or injuries, fever, history of IV drug abuse.  Pain is worse with movement and walking.  No other complaints or concerns.       Home Medications Prior to Admission medications   Medication Sig Start Date End Date Taking? Authorizing Provider  diclofenac (VOLTAREN) 50 MG EC tablet Take 1 tablet (50 mg total) by mouth 2 (two) times daily. 03/18/22  Yes Jeannie Fend, PA-C  methocarbamol (ROBAXIN) 500 MG tablet Take 1 tablet (500 mg total) by mouth 2 (two) times daily. 03/18/22  Yes Jeannie Fend, PA-C  predniSONE (STERAPRED UNI-PAK 21 TAB) 10 MG (21) TBPK tablet Take by mouth daily. Take 6 tabs by mouth daily  for 2 days, then 5 tabs for 2 days, then 4 tabs for 2 days, then 3 tabs for 2 days, 2 tabs for 2 days, then 1 tab by mouth daily for 2 days 03/18/22  Yes Jeannie Fend, PA-C  albuterol (VENTOLIN HFA) 108 (90 Base) MCG/ACT inhaler Inhale into the lungs. 11/27/18   [provider]  cephALEXin (KEFLEX) 500 MG capsule Take 1 capsule (500 mg total) by mouth 4 (four) times daily. 02/08/20   Ward, Layla Maw, DO  doxycycline (VIBRAMYCIN) 100 MG capsule Take 1 capsule (100 mg total) by mouth 2 (two) times daily. 05/11/21   Franne Forts, DO  HYDROcodone-acetaminophen (NORCO/VICODIN) 5-325 MG tablet Take 2 tablets by mouth every 6 (six) hours as needed. 02/08/20   Ward, Layla Maw, DO  ondansetron (ZOFRAN ODT) 4  MG disintegrating tablet Take 1 tablet (4 mg total) by mouth every 6 (six) hours as needed for nausea or vomiting. 02/08/20   Ward, Layla Maw, DO  Probiotic CAPS Take 1 capsule by mouth daily. Take while on antibiotics 02/08/20   Ward, Layla Maw, DO      Allergies    Clindamycin/lincomycin, Morphine, Latex, and Sulfa antibiotics    Review of Systems   Review of Systems Negative except as per HPI Physical Exam Updated Vital Signs BP 127/83 (BP Location: Right Arm)   Pulse (!) 105   Temp 98.6 F (37 C) (Oral)   Resp 18   Ht 5\' 6"  (1.676 m)   Wt 72.6 kg   LMP 02/15/2022 (Approximate)   SpO2 99%   BMI 25.82 kg/m  Physical Exam Vitals and nursing note reviewed.  Constitutional:      General: She is not in acute distress.    Appearance: She is well-developed. She is not diaphoretic.  HENT:     Head: Normocephalic and atraumatic.  Cardiovascular:     Pulses: Normal pulses.  Pulmonary:     Effort: Pulmonary effort is normal.  Abdominal:     Palpations: Abdomen is soft.     Tenderness: There is no abdominal tenderness.  Musculoskeletal:     Lumbar back: Tenderness and bony tenderness present.  Back:     Right lower leg: No edema.     Left lower leg: No edema.     Comments: Diffuse lower back pain including pain at left and right SI joints.  Leg strength is symmetric including great toes.  Sensation intact.  Skin:    General: Skin is warm and dry.     Findings: No erythema or rash.  Neurological:     Mental Status: She is alert and oriented to person, place, and time.     Sensory: No sensory deficit.     Motor: No weakness.     Deep Tendon Reflexes: Reflexes normal.     Reflex Scores:      Patellar reflexes are 1+ on the right side and 1+ on the left side.      Achilles reflexes are 1+ on the right side and 1+ on the left side. Psychiatric:        Behavior: Behavior normal.     ED Results / Procedures / Treatments   Labs (all labs ordered are listed, but only  abnormal results are displayed) Labs Reviewed - No data to display  EKG None  Radiology DG Lumbar Spine Complete  Result Date: 03/18/2022 CLINICAL DATA:  lower back injury patient felt and heard a pop in low back when she coughed. Pain radiating to both legs EXAM: LUMBAR SPINE - COMPLETE 4+ VIEW COMPARISON:  September 19, 2009 FINDINGS: There is no evidence of lumbar spine fracture. Alignment is normal. Again seen are the Schmorl's nodes at the upper endplates of L1 and L2. There is loss of the disc space, endplate sclerosis and vacuum disc phenomenon seen at L4-5 and has progressed in the interim. No pars interarticularis defect. IMPRESSION: Significant progression of the discogenic degenerative changes at L4-5 with loss of the disc space, endplate sclerosis and vacuum disc phenomenon. No fracture or subluxation. Electronically Signed   By: Marjo Bicker M.D.   On: 03/18/2022 15:31    Procedures Procedures    Medications Ordered in ED Medications  methylPREDNISolone sodium succinate (SOLU-MEDROL) 125 mg/2 mL injection 125 mg (has no administration in time range)  ketorolac (TORADOL) 30 MG/ML injection 30 mg (has no administration in time range)  HYDROcodone-acetaminophen (NORCO/VICODIN) 5-325 MG per tablet 1 tablet (has no administration in time range)    ED Course/ Medical Decision Making/ A&P                           Medical Decision Making Amount and/or Complexity of Data Reviewed Radiology: ordered.   46 year old female with complaint of low back pain after coughing today.  She is found to have generalized low back tenderness.  Reflexes are symmetric, strength intact.  X-ray compared to prior lumbar spine series from September 19, 2009.  Show significant progression of the discogenic degenerative changes at L4 and L5 with loss of the disc space, endplate sclerosis and vacuum disc phenomenon.  Negative for fracture or subluxation.  Patient notes similar pain in the past.  She has had a  difficulty following up recently due to loss of insurance.  She accepts referral to [facility] health and wellness.  Patient would benefit from further evaluation with neurosurgery or orthospine.  Given injection of Toradol and Solu-Medrol today with Norco p.o., does have a driver.  Prescription for diclofenac and Robaxin sent into pharmacy as well as prednisone taper.        Final Clinical Impression(s) / ED Diagnoses Final diagnoses:  Lumbar degenerative disc disease    Rx / DC Orders ED Discharge Orders          Ordered    predniSONE (STERAPRED UNI-PAK 21 TAB) 10 MG (21) TBPK tablet  Daily        03/18/22 1558    methocarbamol (ROBAXIN) 500 MG tablet  2 times daily        03/18/22 1558    diclofenac (VOLTAREN) 50 MG EC tablet  2 times daily        03/18/22 1558              Jeannie Fend, PA-C 03/18/22 1611    Virgina Norfolk, DO 03/18/22 1714

## 2022-03-18 NOTE — ED Notes (Signed)
Medicated per ED providers orders. Pt in wheelchair, has family here to drive her home, also informed about the three Rx sent electronically to the pharmacy listed on her EMR. Pt teaching also provided about each medication as well.

## 2022-03-18 NOTE — Discharge Instructions (Addendum)
Medications as prescribed. You can start the Diclofenac and Robaxin this evening. Start Prednisone tomorrow.  Take medications with food. Follow up with [facility]

## 2022-03-18 NOTE — ED Triage Notes (Signed)
Pt states she was sitting down and cough, heard something "pop" in her lower back, now having lower back pain that radiates to both hips. Similar episode in 2010. Reports hx of "back problems for years"

## 2022-04-04 IMAGING — CR DG CHEST 2V
2 series · 2 of 2 positions shown · non-contrast
Comparison: 07/28/2014

CLINICAL DATA: Dry cough

EXAM:
CHEST - 2 VIEW

[w chest pa]
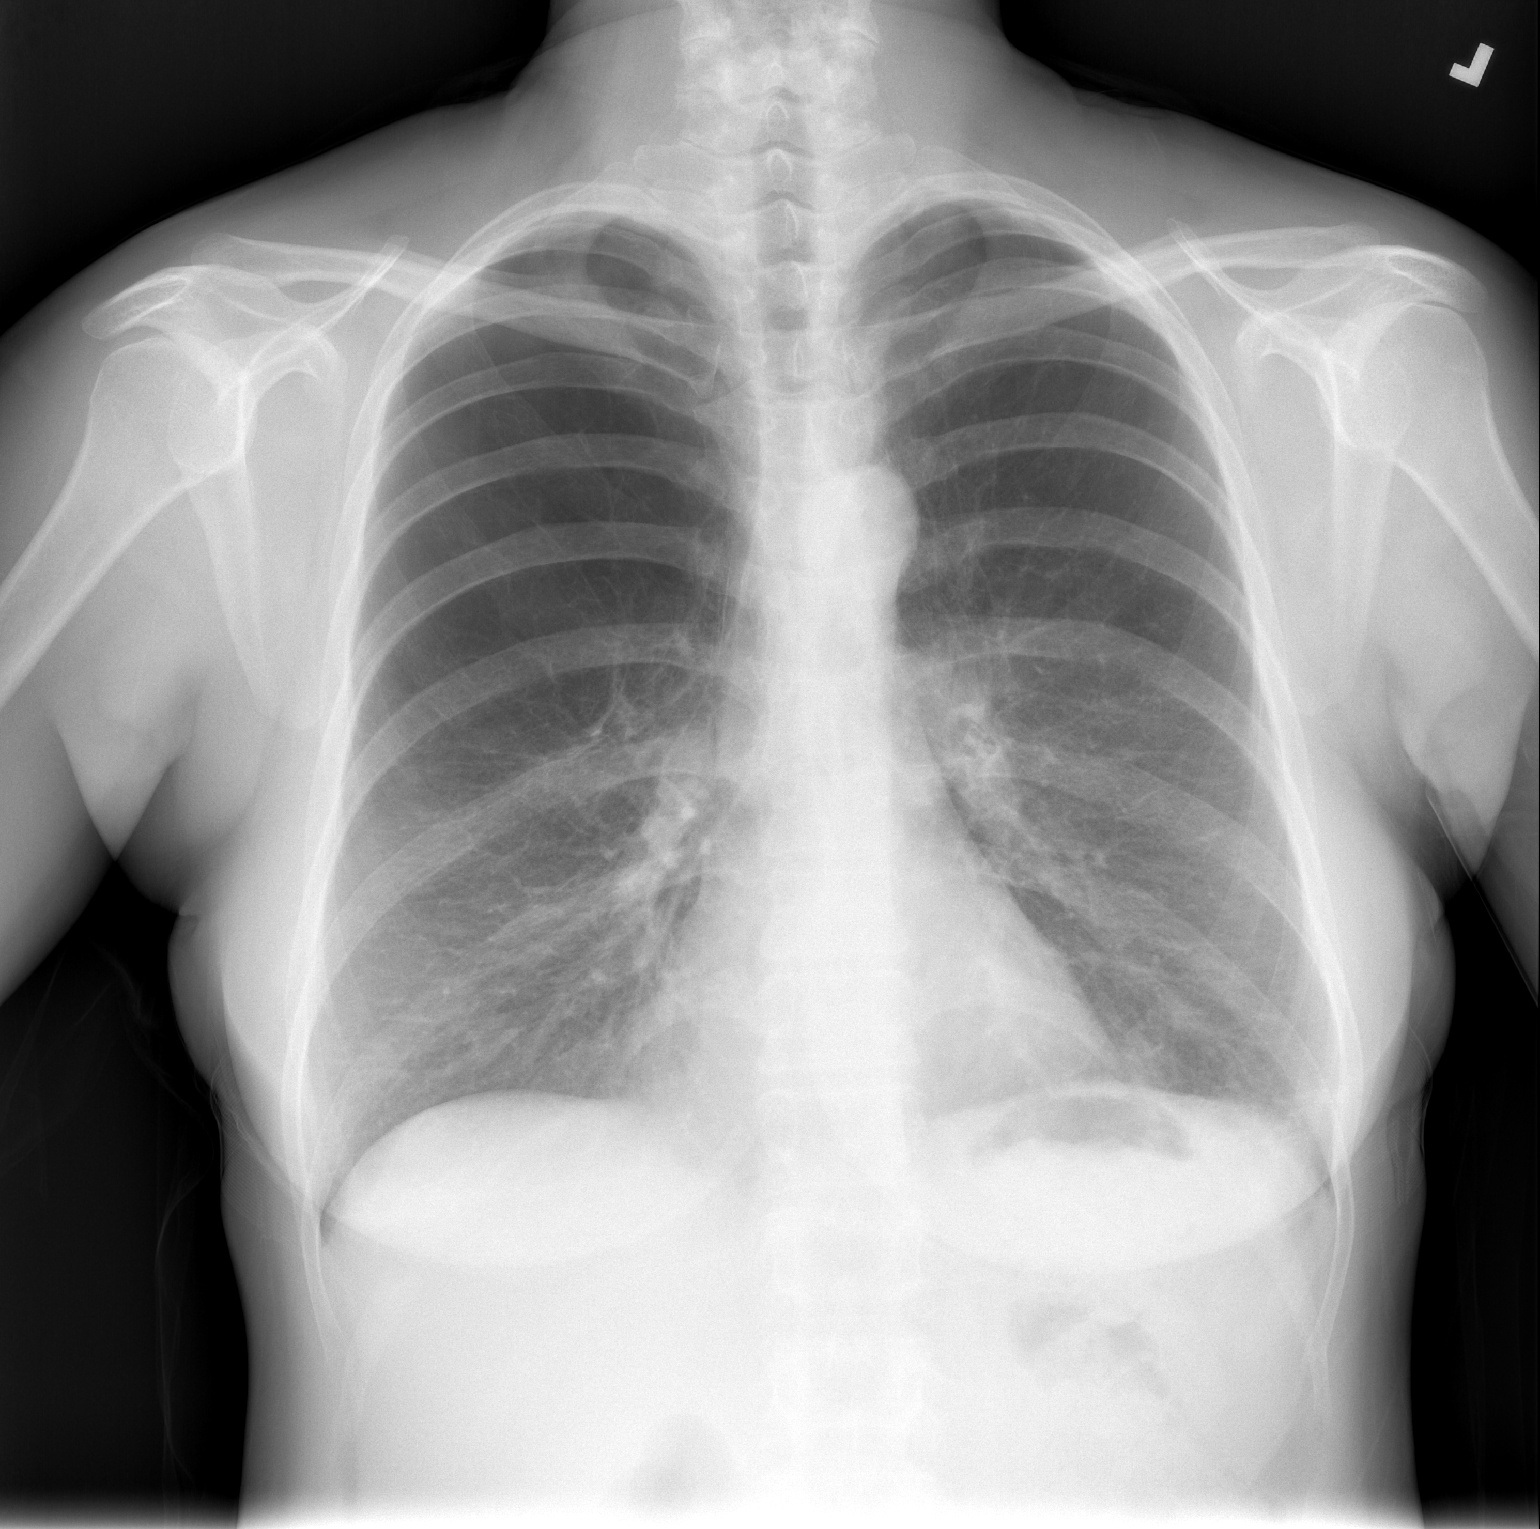

[w chest lat]
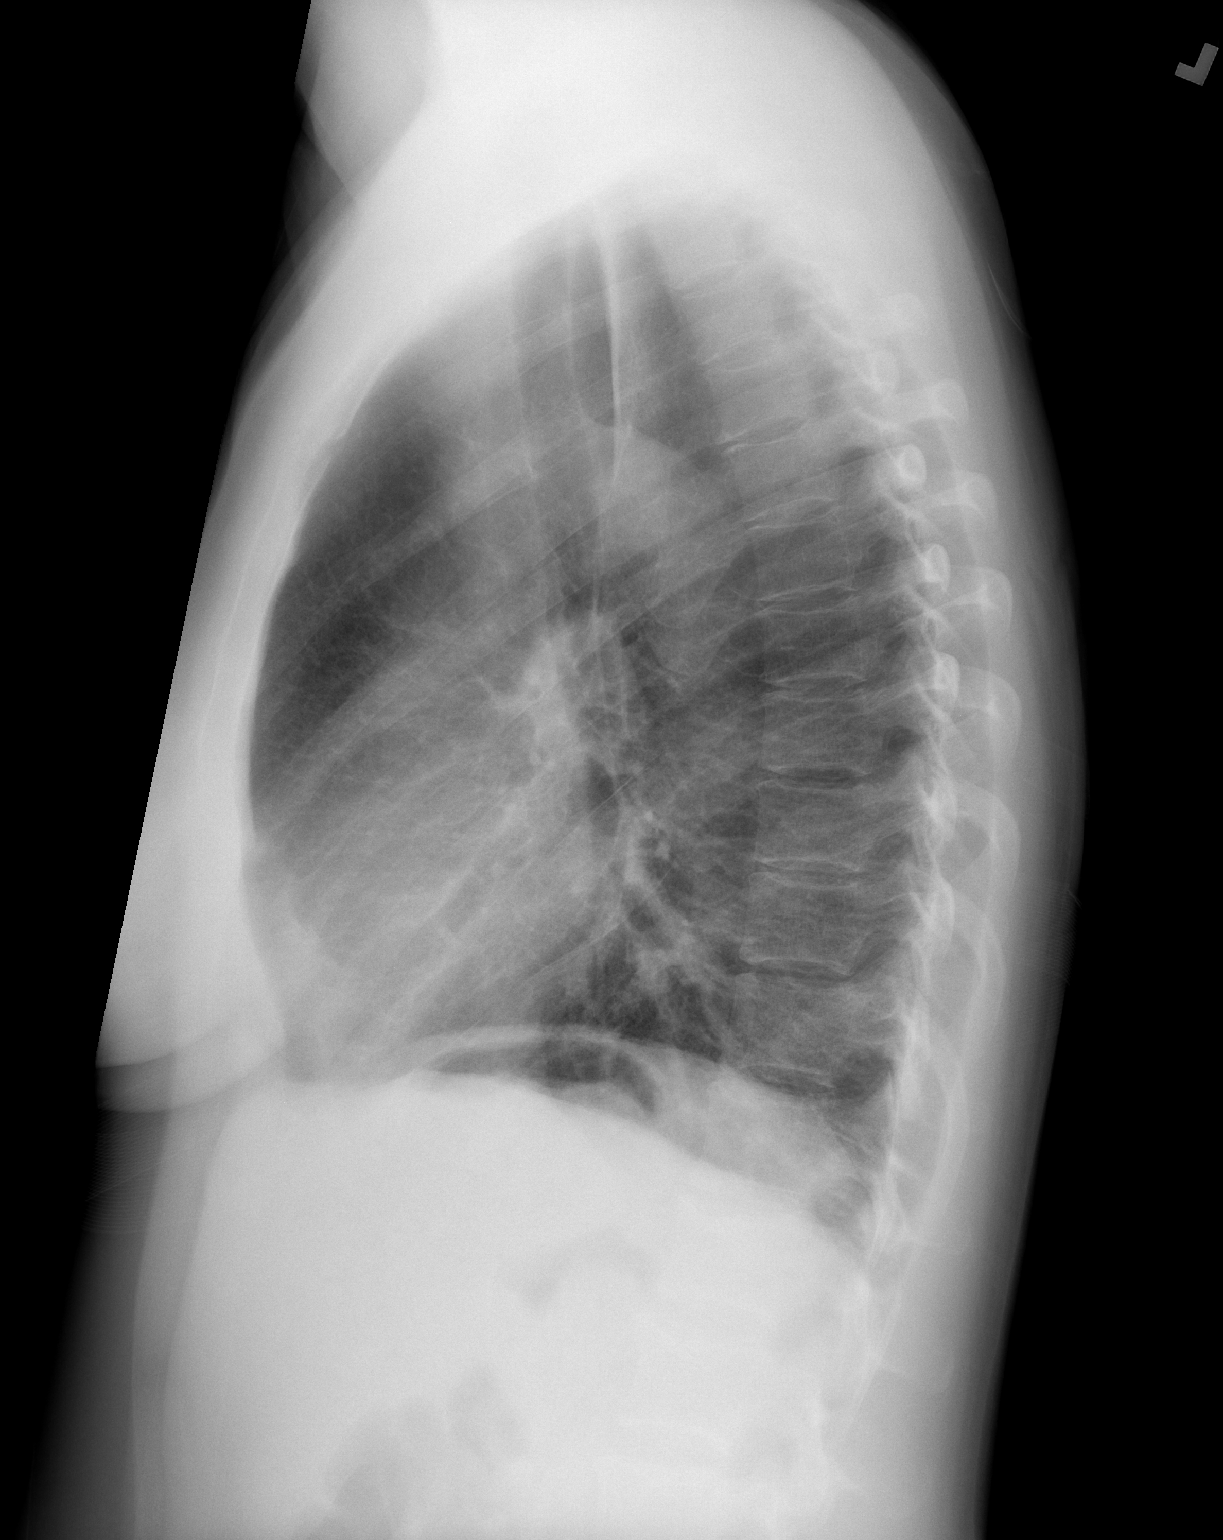

[2 of 2 positions shown; findings below may reference images not displayed]

FINDINGS: Cardiac shadow is within normal limits. Emphysematous changes are
noted with hyperinflation in the upper lobes bilaterally. This is
stable from the prior exam. No focal infiltrate or effusion is seen.
No bony abnormality is noted.
IMPRESSION: COPD without acute abnormality.

## 2022-04-15 ENCOUNTER — Ambulatory Visit: Payer: Medicaid Other | Attending: Physician Assistant | Admitting: Physician Assistant

## 2022-04-15 ENCOUNTER — Other Ambulatory Visit: Payer: Self-pay

## 2022-04-15 VITALS — BP 120/85 | HR 107 | Temp 98.4°F | Resp 13 | Ht 66.0 in | Wt 162.0 lb

## 2022-04-15 DIAGNOSIS — R9389 Abnormal findings on diagnostic imaging of other specified body structures: Secondary | ICD-10-CM

## 2022-04-15 DIAGNOSIS — Z131 Encounter for screening for diabetes mellitus: Secondary | ICD-10-CM

## 2022-04-15 DIAGNOSIS — G629 Polyneuropathy, unspecified: Secondary | ICD-10-CM

## 2022-04-15 DIAGNOSIS — R739 Hyperglycemia, unspecified: Secondary | ICD-10-CM

## 2022-04-15 DIAGNOSIS — M5136 Other intervertebral disc degeneration, lumbar region: Secondary | ICD-10-CM

## 2022-04-15 LAB — GLUCOSE, POCT (MANUAL RESULT ENTRY): POC Glucose: 164 mg/dl — AB (ref 70–99)

## 2022-04-15 MED ORDER — NAPROXEN 500 MG PO TABS
500.0000 mg | ORAL_TABLET | Freq: Two times a day (BID) | ORAL | 1 refills | Status: AC
Start: 2022-04-15 — End: ?
  Filled 2022-04-15: qty 60, 30d supply, fill #0
  Filled 2022-05-19: qty 60, 30d supply, fill #1

## 2022-04-15 MED ORDER — METHOCARBAMOL 500 MG PO TABS
1000.0000 mg | ORAL_TABLET | Freq: Three times a day (TID) | ORAL | 0 refills | Status: DC | PRN
  Filled 2022-04-15: qty 90, 15d supply, fill #0

## 2022-04-15 MED ORDER — GABAPENTIN 300 MG PO CAPS
300.0000 mg | ORAL_CAPSULE | Freq: Every day | ORAL | 3 refills | Status: AC
  Filled 2022-04-15: qty 30, 30d supply, fill #0
  Filled 2022-05-19: qty 30, 30d supply, fill #1

## 2022-04-15 NOTE — Progress Notes (Signed)
Patient ID: Monica Arnold, female   DOB: May 06, 1976, 46 y.o.   MRN: [id number]    Monica Arnold, is a 46 y.o. female  IOX:[id number]  [id number]:[id number]  DOB - 1976/08/02  Chief Complaint  Patient presents with   Hospitalization Follow-up    Imaging said loss of/absent disc space at L4-5  Foot pain inhibiting as well  Ortho referred to Neuro, neuro declined due to lack of insurance       Subjective:   Monica Arnold is a 46 y.o. female here today for a follow up visit and to establish care After ED visit 03/18/2022 for back pain.  This is longstanding since 2010.  No new injury.  Pain is in lower back and radiates down both legs from hips to front of thighs and into sides of knees.  She also has neuropathy of both feet.     From ED note: 46 year old female with complaint of low back pain after coughing today.  She is found to have generalized low back tenderness.  Reflexes are symmetric, strength intact.  X-ray compared to prior lumbar spine series from September 19, 2009.  Show significant progression of the discogenic degenerative changes at L4 and L5 with loss of the disc space, endplate sclerosis and vacuum disc phenomenon.  Negative for fracture or subluxation.  Patient notes similar pain in the past.  She has had a difficulty following up recently due to loss of insurance.  She accepts referral to [facility].  Patient would benefit from further evaluation with neurosurgery or orthospine.  Given injection of Toradol and Solu-Medrol today with Norco p.o., does have a driver.  Prescription for diclofenac and Robaxin sent into pharmacy as well as prednisone taper. No problems updated.  ALLERGIES: Allergies  Allergen Reactions   Clindamycin/Lincomycin Anaphylaxis    blisters   Morphine Itching    OK WHEN TAKEN WITH BENADRYL   Latex Other (See Comments)    Blisters   Sulfa Antibiotics Other (See Comments)    blisters    PAST MEDICAL HISTORY: Past Medical History:  Diagnosis  Date   Back pain    COPD (chronic obstructive pulmonary disease) (HCC)     MEDICATIONS AT HOME: Prior to Admission medications   Medication Sig Start Date End Date Taking? Authorizing Provider  albuterol (VENTOLIN HFA) 108 (90 Base) MCG/ACT inhaler Inhale into the lungs. 11/27/18  Yes [provider]  gabapentin (NEURONTIN) 300 MG capsule Take 1 capsule (300 mg total) by mouth at bedtime. Instructed by Dr. Ihor Gully at [facility] 04/15/22  Yes Aziah Kaiser, Marzella Schlein, PA-C  methocarbamol (ROBAXIN) 500 MG tablet Take 2 tablets (1,000 mg total) by mouth every 8 (eight) hours as needed for muscle spasms. 04/15/22  Yes Georgian Co M, PA-C  naproxen (NAPROSYN) 500 MG tablet Take 1 tablet (500 mg total) by mouth 2 (two) times daily with a meal. 04/15/22  Yes Maxim Bedel M, PA-C  Probiotic CAPS Take 1 capsule by mouth daily. Take while on antibiotics 02/08/20  Yes Ward, Layla Maw, DO  doxycycline (VIBRAMYCIN) 100 MG capsule Take 1 capsule (100 mg total) by mouth 2 (two) times daily. Patient not taking: Reported on 04/15/2022 05/11/21   Edwin Dada P, DO  ondansetron (ZOFRAN ODT) 4 MG disintegrating tablet Take 1 tablet (4 mg total) by mouth every 6 (six) hours as needed for nausea or vomiting. Patient not taking: Reported on 04/15/2022 02/08/20   Ward, Layla Maw, DO  predniSONE (STERAPRED UNI-PAK 21 TAB) 10 MG (21)  TBPK tablet Take by mouth daily. Take 6 tabs by mouth daily  for 2 days, then 5 tabs for 2 days, then 4 tabs for 2 days, then 3 tabs for 2 days, 2 tabs for 2 days, then 1 tab by mouth daily for 2 days Patient not taking: Reported on 04/15/2022 03/18/22   Army Melia A, PA-C    ROS: Neg HEENT Neg resp Neg cardiac Neg GI Neg GU Neg psych Neg neuro  Objective:   Vitals:   04/15/22 1349  BP: 120/85  Pulse: (!) 107  Resp: 13  Temp: 98.4 F (36.9 C)  SpO2: 97%  Weight: 162 lb (73.5 kg)  Height: 5\' 6"  (1.676 m)   Exam General appearance : Awake, alert, not in any  distress. Speech Clear. Not toxic looking. Tangential speech. HEENT: Atraumatic and Normocephalic Neck: Supple, no JVD. No cervical lymphadenopathy.  Chest: Good air entry bilaterally, CTAB.  No rales/rhonchi/wheezing CVS: S1 S2 regular, no murmurs.  Back:  decreased ROM.  Paraspinus spasm BLB.  Neg SLR.  DTR = intact B.   Extremities: B/L Lower Ext shows no edema, both legs are warm to touch Neurology: Awake alert, and oriented X 3, CN II-XII intact, Non focal Skin: No Rash  Data Review No results found for: "HGBA1C"  Assessment & Plan   1. Screening for diabetes mellitus I have had a lengthy discussion and provided education about insulin resistance and the intake of too much sugar/refined carbohydrates.  I have advised the patient to work at a goal of eliminating sugary drinks, candy, desserts, sweets, refined sugars, processed foods, and white carbohydrates.  The patient expresses understanding.  - Glucose (CBG)  2. Abnormal x-ray  - MR Lumbar Spine Wo Contrast; Future  3. Degenerative lumbar disc IMPRESSION: Significant progression of the discogenic degenerative changes at L4-5 with loss of the disc space, endplate sclerosis and vacuum disc phenomenon. No fracture or subluxation. - Comprehensive metabolic panel - MR Lumbar Spine Wo Contrast; Future - naproxen (NAPROSYN) 500 MG tablet; Take 1 tablet (500 mg total) by mouth 2 (two) times daily with a meal.  Dispense: 60 tablet; Refill: 1 - gabapentin (NEURONTIN) 300 MG capsule; Take 1 capsule (300 mg total) by mouth at bedtime. Instructed by Dr. at [facility]  Dispense: 30 capsule; Refill: 3 - methocarbamol (ROBAXIN) 500 MG tablet; Take 2 tablets (1,000 mg total) by mouth every 8 (eight) hours as needed for muscle spasms.  Dispense: 90 tablet; Refill: 0  4. Hyperglycemia I have had a lengthy discussion and provided education about insulin resistance and the intake of too much sugar/refined carbohydrates.  I have  advised the patient to work at a goal of eliminating sugary drinks, candy, desserts, sweets, refined sugars, processed foods, and white carbohydrates.  The patient expresses understanding.  - HgB A1c  5. Neuropathy - gabapentin (NEURONTIN) 300 MG capsule; Take 1 capsule (300 mg total) by mouth at bedtime. Instructed by Dr. [facility] A[facility]ONITO at [facility]  Dispense: 30 capsule; Refill: 3    Return in about 2 months (around 06/15/2022) for assign PCP .  The patient was given clear instructions to go to ER or return to medical center if symptoms don't improve, worsen or new problems develop. The patient verbalized understanding. The patient was told to call to get lab results if they haven't heard anything in the next week.      06/17/2022, PA-C [facility] [place], [place]   04/15/2022, 2:30 PM

## 2022-04-15 NOTE — Patient Instructions (Signed)
Hyperglycemia Hyperglycemia is when the sugar (glucose) level in your blood is too high. High blood sugar can happen to people who have or do not have diabetes. High blood sugar can happen quickly. It can be an emergency. What are the causes? If you have diabetes, high blood sugar may be caused by: Medicines that increase blood sugar or affect your control of diabetes. Getting less physical activity. Overeating. Being sick or injured or having an infection. Having surgery. Stress. Not giving yourself enough insulin (if you are taking it). You may have high blood sugar because you have diabetes that has not been diagnosed yet. If you do not have diabetes, high blood sugar may be caused by: Certain medicines. Stress. A bad illness. An infection. Having surgery. Diseases of the pancreas. What increases the risk? This condition is more likely to develop in people who have risk factors for diabetes, such as: Having a family member with diabetes. Certain conditions in which the body's defense system (immune system) attacks itself. These are called autoimmune disorders. Being overweight. Not being active. Having a condition called insulin resistance. Having a history of: Prediabetes. Diabetes when pregnant. Polycystic ovarian syndrome (PCOS). What are the signs or symptoms? This condition may not cause symptoms. If you do have symptoms, they may include: Feeling more thirsty than normal. Needing to pee (urinate) more often than normal. Hunger. Feeling very tired. Blurry eyesight (vision). You may get other symptoms as the condition gets worse, such as: Dry mouth. Pain in your belly (abdomen). Not being hungry (loss of appetite). Breath that smells fruity. Weakness. Weight loss that is not planned. A tingling or numb feeling in your hands or feet. A headache. Cuts or bruises that heal slowly. How is this treated? Treatment depends on the cause of your condition. Treatment may  include: Taking medicine to control your blood sugar levels. Changing your medicine or dosage if you take insulin or other diabetes medicines. Lifestyle changes. These may include: Exercising more. Eating healthier foods. Losing weight. Treating an illness or infection. Checking your blood sugar more often. Stopping or reducing steroid medicines. If your condition gets very bad, you will need to be treated in the hospital. Follow these instructions at home: General instructions Take over-the-counter and prescription medicines only as told by your doctor. Do not smoke or use any products that contain nicotine or tobacco. If you need help quitting, ask your doctor. If you drink alcohol: Limit how much you have to: 0-1 drink a day for women who are not pregnant. 0-2 drinks a day for men. Know how much alcohol is in a drink. In the [place] one drink equals one 12 oz bottle of beer (355 mL), one 5 oz glass of wine (148 mL), or one 1 oz glass of hard liquor (44 mL). Manage stress. If you need help with this, ask your doctor. Do exercises as told by your doctor. Keep all follow-up visits. Eating and drinking  Stay at a healthy weight. Make sure you drink enough fluid when you: Exercise. Get sick. Are in hot temperatures. Drink enough fluid to keep your pee (urine) pale yellow. If you have diabetes:  Know the symptoms of high blood sugar. Follow your diabetes management plan as told by your doctor. Make sure you: Take insulin and medicines as told. Follow your exercise plan. Follow your meal plan. Eat on time. Do not skip meals. Check your blood sugar as often as told. Make sure you check before and after exercise. If you   exercise longer or in a different way, check your blood sugar more often. Follow your sick day plan whenever you cannot eat or drink normally. Make this plan ahead of time with your doctor. Share your diabetes management plan with people in your workplace, school,  and household. Check your pee for ketones when you are ill and as told by your doctor. Carry a card or wear jewelry that says that you have diabetes. Where to find more information American Diabetes Association: www.diabetes.org Contact a doctor if: Your blood sugar level is at or above 240 mg/dL (13.3 mmol/L) for 2 days in a row. You have problems keeping your blood sugar in your target range. You have high blood pressure often. You have signs of illness, such as: Feeling like you may vomit (feeling nauseous). Vomiting. A fever. Get help right away if: Your blood sugar monitor reads "high" even when you are taking insulin. You have trouble breathing. You have a change in how you think, feel, or act (mental status). You feel like you may vomit, and the feeling does not go away. You cannot stop vomiting. These symptoms may be an emergency. Get medical help right away. Call your local emergency services (911 in the [place] Do not wait to see if the symptoms will go away. Do not drive yourself to the hospital. Summary Hyperglycemia is when the sugar (glucose) level in your blood is too high. High blood sugar can happen to people who have or do not have diabetes. Make sure you drink enough fluids and follow your meal plan. Exercise as often as told by your doctor. Contact your doctor if you have problems keeping your blood sugar in your target range. This information is not intended to replace advice given to you by your health care provider. Make sure you discuss any questions you have with your health care provider. Document Revised: 05/30/2020 Document Reviewed: 05/30/2020 Elsevier Patient Education  2023 [employer]  

## 2022-04-16 LAB — COMPREHENSIVE METABOLIC PANEL
ALT: 19 IU/L (ref 0–32)
AST: 17 IU/L (ref 0–40)
Albumin/Globulin Ratio: 2 (ref 1.2–2.2)
Albumin: 4.5 g/dL (ref 3.9–4.9)
Alkaline Phosphatase: 91 IU/L (ref 44–121)
BUN/Creatinine Ratio: 17 (ref 9–23)
BUN: 14 mg/dL (ref 6–24)
Bilirubin Total: 0.7 mg/dL (ref 0.0–1.2)
CO2: 26 mmol/L (ref 20–29)
Calcium: 9.2 mg/dL (ref 8.7–10.2)
Chloride: 102 mmol/L (ref 96–106)
Creatinine, Ser: 0.84 mg/dL (ref 0.57–1.00)
Globulin, Total: 2.3 g/dL (ref 1.5–4.5)
Glucose: 93 mg/dL (ref 70–99)
Potassium: 4.1 mmol/L (ref 3.5–5.2)
Sodium: 141 mmol/L (ref 134–144)
Total Protein: 6.8 g/dL (ref 6.0–8.5)
eGFR: 87 mL/min/{1.73_m2} (ref >59)

## 2022-04-23 ENCOUNTER — Ambulatory Visit (HOSPITAL_COMMUNITY)
Admission: RE | Admit: 2022-04-23 | Discharge: 2022-04-23 | Disposition: A | Discharge status: Home / Self Care | Payer: No Typology Code available for payment source | Source: Ambulatory Visit | Attending: Physician Assistant | Admitting: Physician Assistant

## 2022-04-23 DIAGNOSIS — R9389 Abnormal findings on diagnostic imaging of other specified body structures: Secondary | ICD-10-CM | POA: Insufficient documentation

## 2022-04-23 DIAGNOSIS — M5136 Other intervertebral disc degeneration, lumbar region: Secondary | ICD-10-CM | POA: Diagnosis present

## 2022-04-27 ENCOUNTER — Other Ambulatory Visit: Payer: Self-pay | Admitting: Physician Assistant

## 2022-04-27 DIAGNOSIS — M5127 Other intervertebral disc displacement, lumbosacral region: Secondary | ICD-10-CM

## 2022-04-27 DIAGNOSIS — M5136 Other intervertebral disc degeneration, lumbar region: Secondary | ICD-10-CM

## 2022-04-27 DIAGNOSIS — M519 Unspecified thoracic, thoracolumbar and lumbosacral intervertebral disc disorder: Secondary | ICD-10-CM

## 2022-04-28 ENCOUNTER — Other Ambulatory Visit: Payer: Self-pay

## 2022-04-28 ENCOUNTER — Other Ambulatory Visit: Payer: Self-pay | Admitting: Physician Assistant

## 2022-04-28 ENCOUNTER — Encounter: Payer: Self-pay | Admitting: Physician Assistant

## 2022-04-28 MED ORDER — TRAMADOL HCL 50 MG PO TABS
50.0000 mg | ORAL_TABLET | Freq: Three times a day (TID) | ORAL | 0 refills | Status: DC | PRN
  Filled 2022-04-28: qty 15, 5d supply, fill #0

## 2022-04-28 MED ORDER — TRAMADOL HCL 50 MG PO TABS: 50.0000 mg | ORAL_TABLET | Freq: Three times a day (TID) | ORAL | 0 refills | Status: AC | PRN

## 2022-04-30 ENCOUNTER — Telehealth: Payer: Self-pay

## 2022-04-30 NOTE — Telephone Encounter (Signed)
Called to do patient pa, since the scan has been done with her insurance they do not allow you to submit pa after the fact

## 2022-05-05 ENCOUNTER — Encounter: Payer: Self-pay | Admitting: Physician Assistant

## 2022-05-19 ENCOUNTER — Other Ambulatory Visit: Payer: Self-pay | Admitting: Physician Assistant

## 2022-05-19 ENCOUNTER — Other Ambulatory Visit (HOSPITAL_COMMUNITY): Payer: Self-pay

## 2022-05-19 DIAGNOSIS — M5136 Other intervertebral disc degeneration, lumbar region: Secondary | ICD-10-CM

## 2022-05-20 ENCOUNTER — Other Ambulatory Visit (HOSPITAL_COMMUNITY): Payer: Self-pay

## 2022-05-20 MED ORDER — METHOCARBAMOL 500 MG PO TABS
1000.0000 mg | ORAL_TABLET | Freq: Three times a day (TID) | ORAL | 0 refills | Status: AC | PRN
Start: 2022-05-20 — End: ?
  Filled 2022-05-20: qty 90, 15d supply, fill #0

## 2022-05-21 ENCOUNTER — Other Ambulatory Visit (HOSPITAL_COMMUNITY): Payer: Self-pay

## 2022-05-29 ENCOUNTER — Other Ambulatory Visit (HOSPITAL_COMMUNITY): Payer: Self-pay

## 2022-06-15 ENCOUNTER — Ambulatory Visit: Payer: Medicaid Other | Admitting: Internal Medicine

## 2022-08-10 DIAGNOSIS — M79673 Pain in unspecified foot: Secondary | ICD-10-CM | POA: Diagnosis not present

## 2022-08-10 DIAGNOSIS — M545 Low back pain, unspecified: Secondary | ICD-10-CM | POA: Diagnosis not present

## 2022-08-18 DIAGNOSIS — M5416 Radiculopathy, lumbar region: Secondary | ICD-10-CM | POA: Diagnosis not present

## 2022-09-15 DIAGNOSIS — G5602 Carpal tunnel syndrome, left upper limb: Secondary | ICD-10-CM | POA: Diagnosis not present

## 2022-09-15 DIAGNOSIS — M5136 Other intervertebral disc degeneration, lumbar region: Secondary | ICD-10-CM | POA: Diagnosis not present

## 2022-09-15 DIAGNOSIS — Z79899 Other long term (current) drug therapy: Secondary | ICD-10-CM | POA: Diagnosis not present

## 2022-09-15 DIAGNOSIS — M5126 Other intervertebral disc displacement, lumbar region: Secondary | ICD-10-CM | POA: Diagnosis not present

## 2022-09-15 DIAGNOSIS — R202 Paresthesia of skin: Secondary | ICD-10-CM | POA: Diagnosis not present

## 2022-09-20 DIAGNOSIS — M549 Dorsalgia, unspecified: Secondary | ICD-10-CM | POA: Diagnosis not present

## 2022-09-20 DIAGNOSIS — Z888 Allergy status to other drugs, medicaments and biological substances status: Secondary | ICD-10-CM | POA: Diagnosis not present

## 2022-09-20 DIAGNOSIS — S29012A Strain of muscle and tendon of back wall of thorax, initial encounter: Secondary | ICD-10-CM | POA: Diagnosis not present

## 2022-09-20 DIAGNOSIS — Z91040 Latex allergy status: Secondary | ICD-10-CM | POA: Diagnosis not present

## 2022-09-20 DIAGNOSIS — M546 Pain in thoracic spine: Secondary | ICD-10-CM | POA: Diagnosis not present

## 2022-09-20 DIAGNOSIS — J449 Chronic obstructive pulmonary disease, unspecified: Secondary | ICD-10-CM | POA: Diagnosis not present

## 2022-09-20 DIAGNOSIS — F17210 Nicotine dependence, cigarettes, uncomplicated: Secondary | ICD-10-CM | POA: Diagnosis not present

## 2022-09-20 DIAGNOSIS — Z882 Allergy status to sulfonamides status: Secondary | ICD-10-CM | POA: Diagnosis not present

## 2022-09-20 DIAGNOSIS — M1990 Unspecified osteoarthritis, unspecified site: Secondary | ICD-10-CM | POA: Diagnosis not present

## 2022-09-20 DIAGNOSIS — M50322 Other cervical disc degeneration at C5-C6 level: Secondary | ICD-10-CM | POA: Diagnosis not present

## 2022-09-20 DIAGNOSIS — Z881 Allergy status to other antibiotic agents status: Secondary | ICD-10-CM | POA: Diagnosis not present

## 2022-09-20 DIAGNOSIS — M47812 Spondylosis without myelopathy or radiculopathy, cervical region: Secondary | ICD-10-CM | POA: Diagnosis not present

## 2022-09-20 DIAGNOSIS — Z79899 Other long term (current) drug therapy: Secondary | ICD-10-CM | POA: Diagnosis not present

## 2022-09-20 DIAGNOSIS — D649 Anemia, unspecified: Secondary | ICD-10-CM | POA: Diagnosis not present

## 2022-09-20 DIAGNOSIS — Z791 Long term (current) use of non-steroidal anti-inflammatories (NSAID): Secondary | ICD-10-CM | POA: Diagnosis not present

## 2022-09-20 DIAGNOSIS — M2578 Osteophyte, vertebrae: Secondary | ICD-10-CM | POA: Diagnosis not present

## 2022-09-20 DIAGNOSIS — R000 Tachycardia, unspecified: Secondary | ICD-10-CM | POA: Diagnosis not present
